# Patient Record
Sex: Female | Born: 1965 | Race: White | Hispanic: No | Marital: Married | Smoking: Never smoker
Health system: Southern US, Community
[De-identification: ages and names within clinical notes are randomized; demographics above are authoritative.]

## PROBLEM LIST (undated history)

## (undated) DIAGNOSIS — D239 Other benign neoplasm of skin, unspecified: Secondary | ICD-10-CM

## (undated) DIAGNOSIS — K9041 Non-celiac gluten sensitivity: Secondary | ICD-10-CM

## (undated) DIAGNOSIS — Z1211 Encounter for screening for malignant neoplasm of colon: Secondary | ICD-10-CM

## (undated) DIAGNOSIS — L570 Actinic keratosis: Secondary | ICD-10-CM

## (undated) DIAGNOSIS — Z8619 Personal history of other infectious and parasitic diseases: Secondary | ICD-10-CM

## (undated) HISTORY — DX: Encounter for screening for malignant neoplasm of colon: Z12.11

## (undated) HISTORY — DX: Actinic keratosis: L57.0

## (undated) HISTORY — DX: Personal history of other infectious and parasitic diseases: Z86.19

## (undated) HISTORY — DX: Other benign neoplasm of skin, unspecified: D23.9

## (undated) HISTORY — DX: Non-celiac gluten sensitivity: K90.41

---
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
## 2007-08-11 ENCOUNTER — Ambulatory Visit: Payer: Self-pay | Admitting: Obstetrics and Gynecology

## 2007-12-24 ENCOUNTER — Encounter: Admission: RE | Admit: 2007-12-24 | Discharge: 2007-12-24 | Payer: Self-pay | Admitting: Gynecology

## 2008-07-14 ENCOUNTER — Ambulatory Visit (HOSPITAL_COMMUNITY): Admission: RE | Admit: 2008-07-14 | Discharge: 2008-07-14 | Payer: Self-pay | Admitting: Gynecology

## 2008-10-13 ENCOUNTER — Encounter (INDEPENDENT_AMBULATORY_CARE_PROVIDER_SITE_OTHER): Payer: Self-pay | Admitting: Obstetrics & Gynecology

## 2008-10-13 ENCOUNTER — Ambulatory Visit (HOSPITAL_COMMUNITY): Admission: RE | Admit: 2008-10-13 | Discharge: 2008-10-13 | Payer: Self-pay | Admitting: Obstetrics & Gynecology

## 2010-01-27 ENCOUNTER — Inpatient Hospital Stay (HOSPITAL_COMMUNITY): Admission: AD | Admit: 2010-01-27 | Discharge: 2010-01-27 | Payer: Self-pay | Admitting: Obstetrics and Gynecology

## 2010-01-27 ENCOUNTER — Inpatient Hospital Stay (HOSPITAL_COMMUNITY): Admission: AD | Admit: 2010-01-27 | Discharge: 2010-01-30 | Payer: Self-pay | Admitting: Obstetrics and Gynecology

## 2010-02-04 ENCOUNTER — Ambulatory Visit: Payer: Self-pay | Admitting: Pediatrics

## 2010-02-19 DIAGNOSIS — D239 Other benign neoplasm of skin, unspecified: Secondary | ICD-10-CM

## 2010-02-19 DIAGNOSIS — C4491 Basal cell carcinoma of skin, unspecified: Secondary | ICD-10-CM

## 2010-02-19 HISTORY — DX: Basal cell carcinoma of skin, unspecified: C44.91

## 2010-02-19 HISTORY — DX: Other benign neoplasm of skin, unspecified: D23.9

## 2010-12-09 LAB — CBC
HCT: 33.4 % — ABNORMAL LOW (ref 36.0–46.0)
Hemoglobin: 9.6 g/dL — ABNORMAL LOW (ref 12.0–15.0)
MCHC: 34.5 g/dL (ref 30.0–36.0)
MCHC: 34.9 g/dL (ref 30.0–36.0)
MCV: 97.2 fL (ref 78.0–100.0)
Platelets: 148 10*3/uL — ABNORMAL LOW (ref 150–400)
RDW: 13.4 % (ref 11.5–15.5)
WBC: 11.9 10*3/uL — ABNORMAL HIGH (ref 4.0–10.5)
WBC: 11.9 10*3/uL — ABNORMAL HIGH (ref 4.0–10.5)

## 2011-01-05 LAB — CBC
HCT: 35 % — ABNORMAL LOW (ref 36.0–46.0)
Hemoglobin: 12.1 g/dL (ref 12.0–15.0)
MCV: 92.7 fL (ref 78.0–100.0)
Platelets: 200 10*3/uL (ref 150–400)
RDW: 13.2 % (ref 11.5–15.5)

## 2011-01-05 LAB — ABO/RH: ABO/RH(D): A POS

## 2011-02-03 NOTE — Op Note (Signed)
Kara Spencer, Kara Spencer             ACCOUNT NO.:  [id number]   MEDICAL RECORD NO.:  [id number]          PATIENT TYPE:  AMB   LOCATION:  SDC                           FACILITY:  [facility]   PHYSICIAN:  Freddy Finner, M.D.   DATE OF BIRTH:  1966/04/10   DATE OF PROCEDURE:  10/13/2008  DATE OF DISCHARGE:                               OPERATIVE REPORT   PREOPERATIVE DIAGNOSIS:  Missed abortion.   POSTOPERATIVE DIAGNOSIS:  Missed abortion.   OPERATIVE PROCEDURE:  Dilation and evacuation.   ANESTHESIA:  Managed IV sedation and paracervical block.   ESTIMATED INTRAOPERATIVE BLOOD LOSS:  10 mL.   INTRAOPERATIVE COMPLICATIONS:  None.  Surgically removed tissue was  submitted for chromosomal analysis.   The patient is a 45 year old gravida 2, para 0 who has IVF pregnancy  which was found to be nonviable approximately 1 week ago.  She is  admitted now for dilation and evacuation.   DESCRIPTION OF PROCEDURE:  She was admitted on the morning of surgery.  She was brought to the operating room, placed under heavy IV sedation,  placed in dorsal lithotomy position using the [place] stirrup system.  Betadine prep of mons, perineum and vagina was carried out in the usual  fashion.  Bivalve speculum was introduced.  Cervix was visualized.  1%  Xylocaine was injected into the anterior cervical lip which was then  grasped with a single-tooth tenaculum.  Additional Xylocaine was  injected at 4 and 8 o'clock in the vaginal fornices as a paracervical  block.  A total of 10 mL of solution was used.  The cervix was then  progressively dilated to 25 with [facility] dilators.  An 8 mm curved suction  cannula was introduced.  Aspiration produced obvious products of  conception.  This was continued until it was felt that the cavity was  completely evacuated.  Gentle thorough curettage was then carried out  and exploration with Randall stone forceps followed by repeat vacuum  aspiration, confirming complete evacuation  of the cavity.  The patient  tolerated the operative procedure well.  The instruments were removed.  She was given  Toradol 30 mg IV, 30 mg IM.  She was taken to recovery in good  condition.  She has Darvocet to be taken as needed for postoperative  pain.  She is to return to the office in approximately 1 week for  postoperative follow-up.  She is given routine outpatient surgical  instructions.      Freddy Finner, M.D.  Electronically Signed     WRN/MEDQ  D:  10/13/2008  T:  10/13/2008  Job:  [id number]   cc:   Leatha Gilding. Mezer, M.D.  Fax: [phone]

## 2011-06-23 LAB — CBC
HCT: 41
Platelets: 226
RBC: 4.34
RDW: 13
WBC: 6.1

## 2011-06-23 LAB — ESTRADIOL: Estradiol: 89.6

## 2011-06-23 LAB — TSH: TSH: 1.158

## 2011-06-23 LAB — FOLLICLE STIMULATING HORMONE: FSH: 6.1

## 2013-07-13 ENCOUNTER — Other Ambulatory Visit: Payer: Self-pay | Admitting: Gynecology

## 2013-07-13 DIAGNOSIS — R928 Other abnormal and inconclusive findings on diagnostic imaging of breast: Secondary | ICD-10-CM

## 2013-08-01 ENCOUNTER — Ambulatory Visit
Admission: RE | Admit: 2013-08-01 | Discharge: 2013-08-01 | Disposition: A | Discharge status: Home / Self Care | Payer: BC Managed Care – PPO | Source: Ambulatory Visit | Attending: Gynecology | Admitting: Gynecology

## 2013-08-01 DIAGNOSIS — R928 Other abnormal and inconclusive findings on diagnostic imaging of breast: Secondary | ICD-10-CM

## 2014-01-29 ENCOUNTER — Ambulatory Visit (INDEPENDENT_AMBULATORY_CARE_PROVIDER_SITE_OTHER): Payer: BC Managed Care – PPO | Admitting: Adult Health

## 2014-01-29 ENCOUNTER — Encounter: Payer: Self-pay | Admitting: Adult Health

## 2014-01-29 ENCOUNTER — Encounter (INDEPENDENT_AMBULATORY_CARE_PROVIDER_SITE_OTHER): Payer: Self-pay

## 2014-01-29 ENCOUNTER — Ambulatory Visit (INDEPENDENT_AMBULATORY_CARE_PROVIDER_SITE_OTHER)
Admission: RE | Admit: 2014-01-29 | Discharge: 2014-01-29 | Disposition: A | Discharge status: Home / Self Care | Payer: BC Managed Care – PPO | Source: Ambulatory Visit | Attending: Adult Health | Admitting: Adult Health

## 2014-01-29 VITALS — BP 122/64 | HR 71 | Temp 98.2°F | Resp 12 | Ht 69.25 in | Wt 135.5 lb

## 2014-01-29 DIAGNOSIS — R071 Chest pain on breathing: Secondary | ICD-10-CM

## 2014-01-29 DIAGNOSIS — R0789 Other chest pain: Secondary | ICD-10-CM

## 2014-01-29 NOTE — Patient Instructions (Signed)
  Please go to our [facility] for your chest xray.  Schedule your complete physical at your earliest convenience. You will need to be fasting.

## 2014-01-29 NOTE — Progress Notes (Signed)
Patient ID: Kara Spencer, female   DOB: 1966-09-09, 48 y.o.   MRN: [id number]   Subjective:    Patient ID: Kara Spencer, female    DOB: [birth year], 48 y.o.   MRN: [id number]  HPI Pt is a 48 y/o female who presents to clinic to establish care. She has not had a PCP. She is followed by GYN. Up to date on mammogram and PAP. She has vague discomfort over chest/rib area on the left side. Both her parents had lung cancer and she is concerned; although, she has never smoked. No shortness of breath.     Past Medical History  Diagnosis Date  . History of chicken pox   . Gluten intolerance     Wheat, caicin     Past Surgical History  Procedure Laterality Date  . Tonsillectomy and adenoidectomy  1973     Family History  Problem Relation Age of Onset  . Lung cancer Mother   . Stroke Mother   . Cancer Mother     lung cancer  . Lung cancer Father   . Cancer Father     lung cancer - smoker  . Cancer Paternal Aunt     ovarian cancer  . Early death Sister     automobile accident  . Heart disease Paternal Grandfather   . Thyroid disease Maternal Grandmother      History   Social History  . Marital Status: Married    Spouse Name: N/A    Number of Children: 1  . Years of Education: 39   Occupational History  . Speech Language Pathologist    Social History Main Topics  . Smoking status: Never Smoker   . Smokeless tobacco: Never Used  . Alcohol Use: Yes     Comment: 2-3 times a month  . Drug Use: No  . Sexual Activity: Yes   Other Topics Concern  . Not on file   Social History Narrative   Saira grew up in [place]. She attended [employer] and obtained her Paediatric nurse in Communication Disorders. She then obtained her Masters in Sport and exercise psychologist from [employer]. She currently lives in [place] with her husband and daughter.    Review of Systems  Constitutional: Negative.   HENT: Negative.   Eyes: Negative.     Respiratory: Negative.        Occasional chest wall pain. She has two parents that developed lung cancer and is concerned. No hx of smoking.  Cardiovascular: Negative.   Gastrointestinal: Negative.   Endocrine: Negative.   Genitourinary: Negative.   Musculoskeletal: Negative.   Skin: Negative.   Allergic/Immunologic: Negative.   Neurological: Negative.   Hematological: Negative.   Psychiatric/Behavioral: Negative.        Objective:  BP 122/64  Pulse 71  Temp(Src) 98.2 F (36.8 C) (Oral)  Resp 12  Ht 5' 9.25" (1.759 m)  Wt 135 lb 8 oz (61.462 kg)  BMI 19.86 kg/m2  SpO2 99%  LMP 01/22/2014   Physical Exam  Constitutional: She is oriented to person, place, and time. She appears well-developed and well-nourished. No distress.  HENT:  Head: Normocephalic and atraumatic.  Right Ear: External ear normal.  Left Ear: External ear normal.  Nose: Nose normal.  Mouth/Throat: Oropharynx is clear and moist.  Eyes: Conjunctivae and EOM are normal. Pupils are equal, round, and reactive to light.  Neck: Normal range of motion. Neck supple. No tracheal deviation present. No thyromegaly present.  Cardiovascular: Normal rate, regular  rhythm, normal heart sounds and intact distal pulses.  Exam reveals no gallop and no friction rub.   No murmur heard. Pulmonary/Chest: Effort normal and breath sounds normal. No respiratory distress. She has no wheezes. She has no rales.  Abdominal: Soft. Bowel sounds are normal. She exhibits no distension and no mass. There is no tenderness. There is no rebound and no guarding.  Musculoskeletal: Normal range of motion. She exhibits no edema and no tenderness.  Lymphadenopathy:    She has no cervical adenopathy.  Neurological: She is alert and oriented to person, place, and time. She has normal reflexes. No cranial nerve deficit. Coordination normal.  Skin: Skin is warm and dry.  Psychiatric: She has a normal mood and affect. Her behavior is normal. Judgment  and thought content normal.      Assessment & Plan:   1. Left-sided chest wall pain She has very vague chest wall pain/discomfort. Does not occur all the time. She is concerned about both parents with lung cancer. ?Muscular. Will send for chest xray r/o costochondritis.  - DG Chest 2 View; Future

## 2014-01-29 NOTE — Progress Notes (Signed)
Pre visit review using our clinic review tool, if applicable. No additional management support is needed unless otherwise documented below in the visit note. 

## 2014-02-01 ENCOUNTER — Other Ambulatory Visit: Payer: BC Managed Care – PPO

## 2014-02-01 ENCOUNTER — Telehealth: Payer: Self-pay | Admitting: *Deleted

## 2014-02-01 NOTE — Telephone Encounter (Signed)
What labs and dX?  

## 2014-02-01 NOTE — Telephone Encounter (Signed)
I have not ordered any labs yet. She is supposed to come for her physical exam and then I am ordering the labs.

## 2014-02-02 NOTE — Telephone Encounter (Signed)
Called pt didn't get an answer will try again later  

## 2014-02-05 ENCOUNTER — Other Ambulatory Visit: Payer: BC Managed Care – PPO

## 2014-02-19 ENCOUNTER — Encounter: Payer: Self-pay | Admitting: Adult Health

## 2014-02-27 ENCOUNTER — Ambulatory Visit (INDEPENDENT_AMBULATORY_CARE_PROVIDER_SITE_OTHER): Payer: BC Managed Care – PPO | Admitting: Adult Health

## 2014-02-27 ENCOUNTER — Encounter: Payer: Self-pay | Admitting: Adult Health

## 2014-02-27 VITALS — BP 116/60 | HR 75 | Temp 98.4°F | Resp 14 | Ht 69.0 in | Wt 136.5 lb

## 2014-02-27 DIAGNOSIS — Z0000 Encounter for general adult medical examination without abnormal findings: Secondary | ICD-10-CM | POA: Insufficient documentation

## 2014-02-27 DIAGNOSIS — Z23 Encounter for immunization: Secondary | ICD-10-CM

## 2014-02-27 NOTE — Progress Notes (Signed)
Pre visit review using our clinic review tool, if applicable. No additional management support is needed unless otherwise documented below in the visit note. 

## 2014-02-27 NOTE — Progress Notes (Signed)
Subjective:    Patient ID: Kara Spencer, female    DOB: 09-Apr-1966, 48 y.o.   MRN: [id number]  HPI  Pleasant 48 yo female presents today for her annual physical. Indicates that overall she is feeling well. She had some diarrhea but has since cleared up. Her gastrointestinal problems have improved significantly since she has not eaten gluten. Describes symptoms of reflux, only when she eats late.   Past Medical History  Diagnosis Date  . History of chicken pox   . Gluten intolerance     Wheat, caicin    Current Outpatient Prescriptions on File Prior to Visit  Medication Sig Dispense Refill  . Ascorbic Acid (VITAMIN C) 1000 MG tablet Take 1,000 mg by mouth 3 (three) times daily.      . ASTAXANTHIN PO Take 12 mg by mouth daily.      . B Complex-C (B-COMPLEX WITH VITAMIN C) tablet Take 1 tablet by mouth daily.      Marland Kitchen DIGESTIVE ENZYMES PO Take 1 tablet by mouth 3 (three) times daily.      . Misc Natural Products (CHLORELLA) 500 MG CAPS Take 500 mg by mouth daily.      . Multiple Vitamin (MULTIVITAMIN) capsule Take 1 capsule by mouth 2 (two) times daily.       . Nutritional Supplements (PYCNOGENOL) 30 MG CAPS Take 1 capsule by mouth daily.      . Omega-3 Fatty Acids (OMEGA 3 PO) Take 800 mg by mouth. Liquid form. At bedtime      . Passion Flower-Valerian 500-500 MG CAPS Take 1 capsule by mouth as needed.      . Probiotic Product (PROBIOTIC DAILY PO) Take 1 tablet by mouth daily.      Marland Kitchen VITAMIN D, CHOLECALCIFEROL, PO Take 5,000 Units by mouth.      . vitamin E 400 UNIT capsule Take 400 Units by mouth daily.       No current facility-administered medications on file prior to visit.     Review of Systems  Constitutional: Negative.  Negative for fever, chills, activity change, fatigue and unexpected weight change.  HENT: Negative for congestion, dental problem, ear discharge, ear pain, facial swelling, hearing loss, mouth sores, nosebleeds, postnasal drip, rhinorrhea, sinus pressure,  sneezing, sore throat, tinnitus, trouble swallowing and voice change.   Eyes: Negative for photophobia, pain, discharge, redness, itching and visual disturbance.       Last eye exam was June 2014 - will be scheduling  Respiratory: Negative for apnea, cough, choking, chest tightness, shortness of breath, wheezing and stridor.   Cardiovascular: Negative.  Negative for chest pain, palpitations and leg swelling.  Gastrointestinal: Negative.  Negative for nausea, vomiting, abdominal pain, diarrhea, constipation, blood in stool, abdominal distention, anal bleeding and rectal pain.  Endocrine: Negative for cold intolerance, heat intolerance, polydipsia, polyphagia and polyuria.  Genitourinary: Positive for frequency. Negative for dysuria, urgency, hematuria, flank pain, enuresis, difficulty urinating, genital sores, menstrual problem, pelvic pain and dyspareunia.  Musculoskeletal: Negative for arthralgias, back pain, gait problem, joint swelling, myalgias and neck stiffness.  Skin: Negative for color change, pallor, rash and wound.  Allergic/Immunologic: Negative.   Neurological: Negative.  Negative for dizziness, tremors, seizures, syncope, facial asymmetry, speech difficulty, weakness, light-headedness, numbness and headaches.  Hematological: Negative.  Does not bruise/bleed easily.  Psychiatric/Behavioral: Negative.  Negative for hallucinations, behavioral problems, confusion, dysphoric mood, decreased concentration and agitation. The patient is not nervous/anxious and is not hyperactive.        Objective:  BP 116/60  Pulse 75  Temp(Src) 98.4 F (36.9 C) (Oral)  Resp 14  Ht 5' 9"  (1.753 m)  Wt 136 lb 8 oz (61.916 kg)  BMI 20.15 kg/m2  SpO2 99%  LMP 02/17/2014   Physical Exam  Vitals reviewed. Constitutional: She is oriented to person, place, and time. She appears well-developed and well-nourished. She is cooperative.  HENT:  Head: Normocephalic.  Right Ear: Hearing, tympanic membrane  and ear canal normal.  Left Ear: Hearing, tympanic membrane, external ear and ear canal normal.  Nose: Nose normal. Right sinus exhibits no maxillary sinus tenderness and no frontal sinus tenderness. Left sinus exhibits no maxillary sinus tenderness and no frontal sinus tenderness.  Mouth/Throat: Uvula is midline, oropharynx is clear and moist and mucous membranes are normal.  Eyes: Conjunctivae, EOM and lids are normal. Pupils are equal, round, and reactive to light.  Neck: Trachea normal, normal range of motion, full passive range of motion without pain and phonation normal. Neck supple. No hepatojugular reflux and no JVD present. Carotid bruit is not present. No thyromegaly present.  Cardiovascular: Normal rate, regular rhythm, S1 normal, S2 normal, intact distal pulses and normal pulses.  Exam reveals no gallop.   No murmur heard. Pulmonary/Chest: Effort normal and breath sounds normal.  Abdominal: Soft. Normal appearance, normal aorta and bowel sounds are normal. There is no hepatosplenomegaly. There is no tenderness. There is no CVA tenderness.  Musculoskeletal: Normal range of motion.  Lymphadenopathy:    She has no cervical adenopathy.    She has no axillary adenopathy.  Neurological: She is alert and oriented to person, place, and time. She has normal strength and normal reflexes. No cranial nerve deficit or sensory deficit. She displays a negative Romberg sign. GCS eye subscore is 4. GCS verbal subscore is 5. GCS motor subscore is 6.  Skin: Skin is warm, dry and intact.  Psychiatric: She has a normal mood and affect. Her speech is normal and behavior is normal. Judgment and thought content normal. Cognition and memory are normal.       Assessment & Plan:   1. Routine general medical examination at a health care facility Normal exam. Screenings addressed. Labs and return for annual exam in 1 year - CBC; Future - Vitamin D 1,25 dihydroxy; Future - Comp Met (CMET); Future - Lipid  Profile; Future - TSH; Future - B12; Future  2. Need for diphtheria-tetanus-pertussis (Tdap) vaccine, adult/adolescent Given Tdap

## 2014-02-27 NOTE — Patient Instructions (Signed)
  You had your yearly physical exam today.  You were given your Tdap vaccine in clinic today.  Return in the morning for fasting labs. Drink water prior to lab appointment. We will contact you once the results are available as well as any further instructions.

## 2014-02-28 ENCOUNTER — Other Ambulatory Visit (INDEPENDENT_AMBULATORY_CARE_PROVIDER_SITE_OTHER): Payer: BC Managed Care – PPO

## 2014-02-28 ENCOUNTER — Telehealth: Payer: Self-pay | Admitting: Adult Health

## 2014-02-28 ENCOUNTER — Encounter: Payer: Self-pay | Admitting: Adult Health

## 2014-02-28 DIAGNOSIS — Z0000 Encounter for general adult medical examination without abnormal findings: Secondary | ICD-10-CM

## 2014-02-28 LAB — COMPREHENSIVE METABOLIC PANEL
ALT: 21 U/L (ref 0–35)
AST: 19 U/L (ref 0–37)
Albumin: 3.8 g/dL (ref 3.5–5.2)
Alkaline Phosphatase: 32 U/L — ABNORMAL LOW (ref 39–117)
BUN: 10 mg/dL (ref 6–23)
CHLORIDE: 106 meq/L (ref 96–112)
CO2: 28 mEq/L (ref 19–32)
Calcium: 9.1 mg/dL (ref 8.4–10.5)
Creatinine, Ser: 0.9 mg/dL (ref 0.4–1.2)
GFR: 74.52 mL/min (ref >60.00)
Glucose, Bld: 83 mg/dL (ref 70–99)
Potassium: 3.9 mEq/L (ref 3.5–5.1)
Sodium: 138 mEq/L (ref 135–145)
TOTAL PROTEIN: 6 g/dL (ref 6.0–8.3)
Total Bilirubin: 0.7 mg/dL (ref 0.2–1.2)

## 2014-02-28 LAB — CBC
HCT: 38.9 % (ref 36.0–46.0)
Hemoglobin: 13 g/dL (ref 12.0–15.0)
MCHC: 33.4 g/dL (ref 30.0–36.0)
MCV: 94.9 fl (ref 78.0–100.0)
Platelets: 189 10*3/uL (ref 150.0–400.0)
RBC: 4.1 Mil/uL (ref 3.87–5.11)
RDW: 13 % (ref 11.5–15.5)
WBC: 4.4 10*3/uL (ref 4.0–10.5)

## 2014-02-28 LAB — LIPID PANEL
Cholesterol: 167 mg/dL (ref 0–200)
HDL: 60.2 mg/dL (ref >39.00)
LDL Cholesterol: 99 mg/dL (ref 0–99)
NonHDL: 106.8
Total CHOL/HDL Ratio: 3
Triglycerides: 38 mg/dL (ref 0.0–149.0)
VLDL: 7.6 mg/dL (ref 0.0–40.0)

## 2014-02-28 LAB — VITAMIN B12: VITAMIN B 12: 1073 pg/mL — AB (ref 211–911)

## 2014-02-28 LAB — TSH: TSH: 0.43 u[IU]/mL (ref 0.35–4.50)

## 2014-02-28 NOTE — Telephone Encounter (Signed)
b12 level high

## 2014-03-04 LAB — VITAMIN D 1,25 DIHYDROXY
VITAMIN D3 1, 25 (OH): 58 pg/mL
Vitamin D 1, 25 (OH)2 Total: 58 pg/mL (ref 18–72)
Vitamin D2 1, 25 (OH)2: <8 pg/mL

## 2014-03-05 ENCOUNTER — Encounter: Payer: Self-pay | Admitting: Adult Health

## 2014-03-05 ENCOUNTER — Encounter: Payer: BC Managed Care – PPO | Admitting: Adult Health

## 2014-06-15 ENCOUNTER — Encounter: Payer: Self-pay | Admitting: Internal Medicine

## 2014-06-15 ENCOUNTER — Ambulatory Visit (INDEPENDENT_AMBULATORY_CARE_PROVIDER_SITE_OTHER): Payer: BC Managed Care – PPO | Admitting: Internal Medicine

## 2014-06-15 VITALS — BP 100/72 | HR 73 | Temp 98.7°F | Ht 69.0 in | Wt 132.8 lb

## 2014-06-15 DIAGNOSIS — R197 Diarrhea, unspecified: Secondary | ICD-10-CM

## 2014-06-15 LAB — CBC WITH DIFFERENTIAL/PLATELET
BASOS ABS: 0 10*3/uL (ref 0.0–0.1)
Basophils Relative: 0 % (ref 0–1)
EOS ABS: 0.1 10*3/uL (ref 0.0–0.7)
Eosinophils Relative: 1 % (ref 0–5)
HEMATOCRIT: 40.7 % (ref 36.0–46.0)
Hemoglobin: 13.8 g/dL (ref 12.0–15.0)
Lymphocytes Relative: 37 % (ref 12–46)
Lymphs Abs: 2.1 10*3/uL (ref 0.7–4.0)
MCH: 31.2 pg (ref 26.0–34.0)
MCHC: 33.9 g/dL (ref 30.0–36.0)
MCV: 91.9 fL (ref 78.0–100.0)
Monocytes Absolute: 0.5 10*3/uL (ref 0.1–1.0)
Monocytes Relative: 8 % (ref 3–12)
Neutro Abs: 3.1 10*3/uL (ref 1.7–7.7)
Neutrophils Relative %: 54 % (ref 43–77)
PLATELETS: 229 10*3/uL (ref 150–400)
RBC: 4.43 MIL/uL (ref 3.87–5.11)
RDW: 13.7 % (ref 11.5–15.5)
WBC: 5.7 10*3/uL (ref 4.0–10.5)

## 2014-06-15 LAB — COMPREHENSIVE METABOLIC PANEL
ALT: 10 U/L (ref 0–35)
AST: 13 U/L (ref 0–37)
Albumin: 4.4 g/dL (ref 3.5–5.2)
Alkaline Phosphatase: 35 U/L — ABNORMAL LOW (ref 39–117)
BUN: 14 mg/dL (ref 6–23)
CO2: 27 mEq/L (ref 19–32)
CREATININE: 0.98 mg/dL (ref 0.50–1.10)
Calcium: 9.6 mg/dL (ref 8.4–10.5)
Chloride: 103 mEq/L (ref 96–112)
Glucose, Bld: 98 mg/dL (ref 70–99)
Potassium: 5 mEq/L (ref 3.5–5.3)
Sodium: 137 mEq/L (ref 135–145)
Total Bilirubin: 0.6 mg/dL (ref 0.2–1.2)
Total Protein: 6.5 g/dL (ref 6.0–8.3)

## 2014-06-15 NOTE — Patient Instructions (Signed)
Labs today.  Follow up in 4 weeks or sooner as needed. 

## 2014-06-15 NOTE — Progress Notes (Signed)
Pre visit review using our clinic review tool, if applicable. No additional management support is needed unless otherwise documented below in the visit note. 

## 2014-06-15 NOTE — Assessment & Plan Note (Signed)
Symptoms of loose stools for 4 weeks. Will check CBC, CMP. Will send stool culture, CDiff, and stool for ova and parasites. We discussed if these tests are unrevealing then will plan for GI evaluation with colonoscopy. Follow up in 4 weeks and prn.

## 2014-06-15 NOTE — Progress Notes (Signed)
   Subjective:    Patient ID: Kara Spencer, female    DOB: 01-15-1966, 48 y.o.   MRN: [id number]  HPI 48YO female presents for acute visit.  Diarrhea - Started 4 weeks ago after eating at food [employer]). Occurs only in the morning. Watery/soft diarrhea, non-bloody. More like pudding consistency. No urgency. No abdominal pain. Occasional nausea. Had similar episode in May which resolved. Not on antibiotics. No travel or camping. No fever.   Review of Systems  Constitutional: Negative for fever, chills, appetite change, fatigue and unexpected weight change.  Eyes: Negative for visual disturbance.  Respiratory: Negative for shortness of breath.   Cardiovascular: Negative for chest pain and leg swelling.  Gastrointestinal: Positive for nausea and diarrhea. Negative for vomiting, abdominal pain, constipation, blood in stool, abdominal distention and anal bleeding.  Skin: Negative for color change and rash.  Hematological: Negative for adenopathy. Does not bruise/bleed easily.  Psychiatric/Behavioral: Negative for dysphoric mood. The patient is not nervous/anxious.        Objective:    BP 100/72  Pulse 73  Temp(Src) 98.7 F (37.1 C) (Oral)  Ht $R'5\' 9"'Sl$  (1.753 m)  Wt 132 lb 12 oz (60.215 kg)  BMI 19.59 kg/m2  SpO2 99%  LMP 05/21/2014 Physical Exam  Constitutional: She is oriented to person, place, and time. She appears well-developed and well-nourished. No distress.  HENT:  Head: Normocephalic and atraumatic.  Right Ear: External ear normal.  Left Ear: External ear normal.  Nose: Nose normal.  Mouth/Throat: Oropharynx is clear and moist.  Eyes: Conjunctivae and EOM are normal. Pupils are equal, round, and reactive to light. Right eye exhibits no discharge.  Neck: Normal range of motion. Neck supple. No thyromegaly present.  Cardiovascular: Normal rate, regular rhythm, normal heart sounds and intact distal pulses.  Exam reveals no gallop and no friction rub.   No murmur  heard. Pulmonary/Chest: Effort normal. No respiratory distress. She has no wheezes. She has no rales.  Abdominal: Soft. Bowel sounds are normal. She exhibits no distension and no mass. There is no tenderness. There is no rebound and no guarding.  Musculoskeletal: Normal range of motion. She exhibits no edema and no tenderness.  Lymphadenopathy:    She has no cervical adenopathy.  Neurological: She is alert and oriented to person, place, and time. No cranial nerve deficit. Coordination normal.  Skin: Skin is warm and dry. No rash noted. She is not diaphoretic. No erythema. No pallor.  Psychiatric: She has a normal mood and affect. Her behavior is normal. Judgment and thought content normal.          Assessment & Plan:   Problem List Items Addressed This Visit     Unprioritized   Diarrhea - Primary     Symptoms of loose stools for 4 weeks. Will check CBC, CMP. Will send stool culture, CDiff, and stool for ova and parasites. We discussed if these tests are unrevealing then will plan for GI evaluation with colonoscopy. Follow up in 4 weeks and prn.    Relevant Orders      CBC with Differential      Comp Met (CMET)      Stool Culture      Ova and parasite examination      Stool C-Diff Toxin Assay      H. pylori breath test       Return in about 4 weeks (around 07/13/2014) for Recheck.

## 2014-06-17 LAB — H. PYLORI BREATH TEST: H. PYLORI BREATH TEST: NOT DETECTED

## 2014-06-19 LAB — OVA AND PARASITE EXAMINATION: OP: NONE SEEN

## 2014-06-19 LAB — C. DIFFICILE GDH AND TOXIN A/B
C. DIFFICILE GDH: NOT DETECTED
C. difficile Toxin A/B: NOT DETECTED

## 2014-06-22 LAB — STOOL CULTURE

## 2015-05-06 ENCOUNTER — Other Ambulatory Visit: Payer: Self-pay | Admitting: Gynecology

## 2015-05-07 LAB — CYTOLOGY - PAP

## 2015-07-03 ENCOUNTER — Encounter: Payer: Self-pay | Admitting: Family Medicine

## 2015-07-03 ENCOUNTER — Ambulatory Visit (INDEPENDENT_AMBULATORY_CARE_PROVIDER_SITE_OTHER): Payer: BC Managed Care – PPO | Admitting: Family Medicine

## 2015-07-03 VITALS — BP 102/62 | HR 75 | Temp 98.1°F | Resp 12 | Ht 69.0 in | Wt 132.8 lb

## 2015-07-03 DIAGNOSIS — Z1321 Encounter for screening for nutritional disorder: Secondary | ICD-10-CM | POA: Diagnosis not present

## 2015-07-03 DIAGNOSIS — L659 Nonscarring hair loss, unspecified: Secondary | ICD-10-CM | POA: Diagnosis not present

## 2015-07-03 DIAGNOSIS — Z13220 Encounter for screening for lipoid disorders: Secondary | ICD-10-CM

## 2015-07-03 DIAGNOSIS — Z87898 Personal history of other specified conditions: Secondary | ICD-10-CM

## 2015-07-03 DIAGNOSIS — Z8669 Personal history of other diseases of the nervous system and sense organs: Secondary | ICD-10-CM

## 2015-07-03 DIAGNOSIS — Z0000 Encounter for general adult medical examination without abnormal findings: Secondary | ICD-10-CM | POA: Diagnosis not present

## 2015-07-03 DIAGNOSIS — Z130 Encounter for screening for diseases of the blood and blood-forming organs and certain disorders involving the immune mechanism: Secondary | ICD-10-CM | POA: Diagnosis not present

## 2015-07-03 HISTORY — DX: Personal history of other specified conditions: Z87.898

## 2015-07-03 LAB — CBC
HCT: 41.9 % (ref 36.0–46.0)
Hemoglobin: 13.6 g/dL (ref 12.0–15.0)
MCHC: 32.4 g/dL (ref 30.0–36.0)
MCV: 96.1 fl (ref 78.0–100.0)
Platelets: 219 10*3/uL (ref 150.0–400.0)
RBC: 4.36 Mil/uL (ref 3.87–5.11)
RDW: 13.3 % (ref 11.5–15.5)
WBC: 4.6 10*3/uL (ref 4.0–10.5)

## 2015-07-03 LAB — LIPID PANEL
CHOL/HDL RATIO: 3
Cholesterol: 156 mg/dL (ref 0–200)
HDL: 62.2 mg/dL (ref >39.00)
LDL CALC: 85 mg/dL (ref 0–99)
NONHDL: 93.95
Triglycerides: 45 mg/dL (ref 0.0–149.0)
VLDL: 9 mg/dL (ref 0.0–40.0)

## 2015-07-03 LAB — COMPREHENSIVE METABOLIC PANEL
ALT: 17 U/L (ref 0–35)
AST: 19 U/L (ref 0–37)
Albumin: 4.1 g/dL (ref 3.5–5.2)
Alkaline Phosphatase: 37 U/L — ABNORMAL LOW (ref 39–117)
BUN: 12 mg/dL (ref 6–23)
CHLORIDE: 104 meq/L (ref 96–112)
CO2: 27 mEq/L (ref 19–32)
CREATININE: 0.89 mg/dL (ref 0.40–1.20)
Calcium: 9.4 mg/dL (ref 8.4–10.5)
GFR: 72.16 mL/min (ref >60.00)
Glucose, Bld: 78 mg/dL (ref 70–99)
Potassium: 3.9 mEq/L (ref 3.5–5.1)
SODIUM: 139 meq/L (ref 135–145)
Total Bilirubin: 0.5 mg/dL (ref 0.2–1.2)
Total Protein: 6.5 g/dL (ref 6.0–8.3)

## 2015-07-03 LAB — VITAMIN D 25 HYDROXY (VIT D DEFICIENCY, FRACTURES): VITD: 77.86 ng/mL (ref 30.00–100.00)

## 2015-07-03 LAB — TSH: TSH: 0.92 u[IU]/mL (ref 0.35–4.50)

## 2015-07-03 NOTE — Progress Notes (Signed)
Subjective:  Patient ID: Kara Spencer, female    DOB: 17-Mar-1966  Age: 49 y.o. MRN: [id number]  CC: Annual physical exam; Establish care   HPI:  49 year old female presents to clinic today to establish care with me. She is in need of an annual physical exam.  Preventative Healthcare  Pap smear: UTD.  Mammogram/Breast exam: Has upcoming mammogram in Nov.  Colonoscopy: N/A.  Immunizations: UTD.  Labs: In need of labs today.   Diet/Exercise: Eats healthy and has dietary restrictions (wheat, casein)  Alcohol use: No.  Smoking/tobacco use: No.  Regular dental exams: Yes.   PMH, Surgical Hx, Family Hx, Social History reviewed and updated as below.  Past Medical History  Diagnosis Date  . History of chicken pox   . Gluten intolerance     Wheat, caicin   Past Surgical History  Procedure Laterality Date  . Tonsillectomy and adenoidectomy  1973   Family History  Problem Relation Age of Onset  . Lung cancer Mother   . Stroke Mother   . Cancer Mother     lung cancer  . Lung cancer Father   . Cancer Father     lung cancer - smoker  . Cancer Paternal Aunt     ovarian cancer  . Early death Sister     automobile accident  . Heart disease Paternal Grandfather   . Thyroid disease Maternal Grandmother    Social History  Substance Use Topics  . Smoking status: Never Smoker   . Smokeless tobacco: Never Used  . Alcohol Use: Yes     Comment: 2-3 times a month   Review of Systems  Gastrointestinal:       Diarrhea last month.   Neurological:       History of vertigo.   All other systems negative.  Objective:  BP 102/62 mmHg  Pulse 75  Temp(Src) 98.1 F (36.7 C) (Oral)  Resp 12  Ht _0  (1.753 m)  Wt 132 lb 12.8 oz (60.238 kg)  BMI 19.60 kg/m2  SpO2 98%  LMP 06/22/2015 (Approximate)  BP/Weight 07/03/2015 5/63/8756 12/22/3293  Systolic BP 188 416 606  Diastolic BP 62 72 60  Wt. (Lbs) 132.8 132.75 136.5  BMI 19.6 19.59 20.15   Physical Exam    Constitutional: She is oriented to person, place, and time. She appears well-developed and well-nourished. No distress.  HENT:  Head: Normocephalic and atraumatic.  Nose: Nose normal.  Mouth/Throat: Oropharynx is clear and moist. No oropharyngeal exudate.  Normal TM's bilaterally.   Eyes: Conjunctivae are normal. No scleral icterus.  Neck: Neck supple. No thyromegaly present.  Cardiovascular: Normal rate and regular rhythm.   No murmur heard. Pulmonary/Chest: Effort normal and breath sounds normal. She has no wheezes. She has no rales.  Abdominal: Soft. She exhibits no distension. There is no tenderness. There is no rebound and no guarding.  Musculoskeletal: Normal range of motion. She exhibits no edema.  Lymphadenopathy:    She has no cervical adenopathy.  Neurological: She is alert and oriented to person, place, and time.  Skin: Skin is warm and dry. No rash noted.  Psychiatric: She has a normal mood and affect.  Vitals reviewed.  Lab Results  Component Value Date   WBC 5.7 06/15/2014   HGB 13.8 06/15/2014   HCT 40.7 06/15/2014   PLT 229 06/15/2014   GLUCOSE 98 06/15/2014   CHOL 167 02/28/2014   TRIG 38.0 02/28/2014   HDL 60.20 02/28/2014   LDLCALC 99 02/28/2014   ALT  10 06/15/2014   AST 13 06/15/2014   NA 137 06/15/2014   K 5.0 06/15/2014   CL 103 06/15/2014   CREATININE 0.98 06/15/2014   BUN 14 06/15/2014   CO2 27 06/15/2014   TSH 0.43 02/28/2014   Assessment & Plan:   Problem List Items Addressed This Visit    Preventative health care - Primary    Immunizations, Pap smear, mammogram up-to-date. Patient declined flu shot today. Remainder of preventative healthcare up-to-date. Obtaining labs today: CBC, CMP, lipid, TSH, vitamin D.      History of vertigo    Other Visit Diagnoses    Screening for lipid disorders        Relevant Orders    Lipid panel    Screening for deficiency anemia        Relevant Orders    CBC    Hair thinning        Relevant Orders     Comp Met (CMET)    TSH    Encounter for vitamin deficiency screening        Relevant Orders    Vitamin D (25 hydroxy)      Follow-up: Annually.  Thersa Salt, DO

## 2015-07-03 NOTE — Assessment & Plan Note (Signed)
Immunizations, Pap smear, mammogram up-to-date. Patient declined flu shot today. Remainder of preventative healthcare up-to-date. Obtaining labs today: CBC, CMP, lipid, TSH, vitamin D.

## 2015-07-03 NOTE — Patient Instructions (Signed)
It was nice to see you today.  Follow up annually or sooner if needed.   We will call with your lab results.  Take care  Dr. Lacinda Axon

## 2015-07-03 NOTE — Progress Notes (Signed)
Pre visit review using our clinic review tool, if applicable. No additional management support is needed unless otherwise documented below in the visit note. 

## 2016-07-24 ENCOUNTER — Encounter: Payer: BC Managed Care – PPO | Admitting: Family Medicine

## 2016-08-05 ENCOUNTER — Encounter: Payer: Self-pay | Admitting: Family Medicine

## 2016-08-05 ENCOUNTER — Ambulatory Visit (INDEPENDENT_AMBULATORY_CARE_PROVIDER_SITE_OTHER): Payer: BC Managed Care – PPO | Admitting: Family Medicine

## 2016-08-05 VITALS — BP 116/60 | HR 83 | Temp 98.7°F | Resp 12 | Ht 69.0 in | Wt 138.1 lb

## 2016-08-05 DIAGNOSIS — Z0000 Encounter for general adult medical examination without abnormal findings: Secondary | ICD-10-CM | POA: Diagnosis not present

## 2016-08-05 DIAGNOSIS — Z1231 Encounter for screening mammogram for malignant neoplasm of breast: Secondary | ICD-10-CM | POA: Diagnosis not present

## 2016-08-05 DIAGNOSIS — Z1239 Encounter for other screening for malignant neoplasm of breast: Secondary | ICD-10-CM

## 2016-08-05 LAB — COMPREHENSIVE METABOLIC PANEL
ALT: 12 U/L (ref 0–35)
AST: 14 U/L (ref 0–37)
Albumin: 4.4 g/dL (ref 3.5–5.2)
Alkaline Phosphatase: 40 U/L (ref 39–117)
BILIRUBIN TOTAL: 0.4 mg/dL (ref 0.2–1.2)
BUN: 15 mg/dL (ref 6–23)
CALCIUM: 9.7 mg/dL (ref 8.4–10.5)
CO2: 30 meq/L (ref 19–32)
Chloride: 103 mEq/L (ref 96–112)
Creatinine, Ser: 0.9 mg/dL (ref 0.40–1.20)
GFR: 70.91 mL/min (ref >60.00)
Glucose, Bld: 106 mg/dL — ABNORMAL HIGH (ref 70–99)
Potassium: 4.8 mEq/L (ref 3.5–5.1)
Sodium: 138 mEq/L (ref 135–145)
Total Protein: 6.6 g/dL (ref 6.0–8.3)

## 2016-08-05 LAB — LIPID PANEL
CHOL/HDL RATIO: 2
CHOLESTEROL: 170 mg/dL (ref 0–200)
HDL: 68.9 mg/dL (ref >39.00)
LDL Cholesterol: 93 mg/dL (ref 0–99)
NonHDL: 100.69
TRIGLYCERIDES: 38 mg/dL (ref 0.0–149.0)
VLDL: 7.6 mg/dL (ref 0.0–40.0)

## 2016-08-05 LAB — CBC
HCT: 39.7 % (ref 36.0–46.0)
HEMOGLOBIN: 13.4 g/dL (ref 12.0–15.0)
MCHC: 33.8 g/dL (ref 30.0–36.0)
MCV: 93.6 fl (ref 78.0–100.0)
PLATELETS: 189 10*3/uL (ref 150.0–400.0)
RBC: 4.24 Mil/uL (ref 3.87–5.11)
RDW: 13.3 % (ref 11.5–15.5)
WBC: 7.3 10*3/uL (ref 4.0–10.5)

## 2016-08-05 NOTE — Assessment & Plan Note (Signed)
Preventative healthcare up-to-date other than mammogram. Mammogram ordered. Labs today.

## 2016-08-05 NOTE — Progress Notes (Signed)
Subjective:  Patient ID: Kara Spencer, female    DOB: 07-Jan-1966  Age: 50 y.o. MRN: [id number]  CC: Annual exam.   HPI Kara Spencer is a 50 y.o. female presents to the clinic today for an annual physical exam.  Preventative Healthcare  Pap smear: Up to date. Sees GYN.  Mammogram: In need of.  Colonoscopy: Not indicated.   Immunizations  Tetanus - Up to date.   Pneumococcal - Not indicated.   Flu - Up to date.  Labs: Needs labs today.   Alcohol use: See below.  Smoking/tobacco use: Nonsmoker.  PMH, Surgical Hx, Family Hx, Social History reviewed and updated as below.  Past Medical History:  Diagnosis Date  . Gluten intolerance    Wheat, caicin  . History of chicken pox    Past Surgical History:  Procedure Laterality Date  . TONSILLECTOMY AND ADENOIDECTOMY  1973   Family History  Problem Relation Age of Onset  . Lung cancer Mother   . Stroke Mother   . Cancer Mother     lung cancer  . Lung cancer Father   . Cancer Father     lung cancer - smoker  . Cancer Paternal Aunt     ovarian cancer  . Early death Sister     automobile accident  . Heart disease Paternal Grandfather   . Thyroid disease Maternal Grandmother    Social History  Substance Use Topics  . Smoking status: Never Smoker  . Smokeless tobacco: Never Used  . Alcohol use Yes     Comment: 2-3 times a month   Review of Systems  Musculoskeletal:       Left buttock pain; anterior L leg pain.  All other systems reviewed and are negative.  Objective:   Today's Vitals: BP 116/60 (BP Location: Left Arm, Patient Position: Sitting, Cuff Size: Normal)   Pulse 83   Temp 98.7 F (37.1 C) (Oral)   Resp 12   Ht 5\' 9"  (1.753 m)   Wt 138 lb 2 oz (62.7 kg)   SpO2 98%   BMI 20.40 kg/m   Physical Exam  Constitutional: She is oriented to person, place, and time. She appears well-developed and well-nourished. No distress.  HENT:  Head: Normocephalic and atraumatic.  Nose: Nose normal.    Mouth/Throat: Oropharynx is clear and moist. No oropharyngeal exudate.  Normal TM's bilaterally.   Eyes: Conjunctivae are normal. No scleral icterus.  Neck: Neck supple.  Cardiovascular: Normal rate and regular rhythm.   No murmur heard. Pulmonary/Chest: Effort normal and breath sounds normal. She has no wheezes. She has no rales.  Abdominal: Soft. She exhibits no distension. There is no tenderness. There is no rebound and no guarding.  Musculoskeletal: Normal range of motion. She exhibits no edema.  Lymphadenopathy:    She has no cervical adenopathy.  Neurological: She is alert and oriented to person, place, and time.  Skin: Skin is warm and dry. No rash noted.  Psychiatric: She has a normal mood and affect.  Vitals reviewed.  Assessment & Plan:   Problem List Items Addressed This Visit    Annual physical exam - Primary    Preventative healthcare up-to-date other than mammogram. Mammogram ordered. Labs today.      Relevant Orders   CBC   Comprehensive metabolic panel   Lipid panel    Other Visit Diagnoses    Screening for breast cancer       Relevant Orders   MM DIGITAL SCREENING BILATERAL  Outpatient Encounter Prescriptions as of 08/05/2016  Medication Sig  . Ascorbic Acid (VITAMIN C) 1000 MG tablet Take 1,000 mg by mouth 3 (three) times daily.  . ASTAXANTHIN PO Take 12 mg by mouth daily.  . B Complex-C (B-COMPLEX WITH VITAMIN C) tablet Take 1 tablet by mouth daily.  Marland Kitchen DIGESTIVE ENZYMES PO Take 1 tablet by mouth 3 (three) times daily.  . Misc Natural Products (CHLORELLA) 500 MG CAPS Take 500 mg by mouth daily.  . Multiple Vitamin (MULTIVITAMIN) capsule Take 1 capsule by mouth 2 (two) times daily.   . Nutritional Supplements (PYCNOGENOL) 30 MG CAPS Take 1 capsule by mouth daily.  . Omega-3 Fatty Acids (OMEGA 3 PO) Take 800 mg by mouth. Liquid form. At bedtime  . Probiotic Product (PROBIOTIC DAILY PO) Take 1 tablet by mouth daily.  Marland Kitchen VITAMIN D, CHOLECALCIFEROL, PO  Take 5,000 Units by mouth.   No facility-administered encounter medications on file as of 08/05/2016.     Follow-up: Return in about 1 year (around 08/05/2017).  [place]

## 2016-08-05 NOTE — Patient Instructions (Signed)
You're doing well.  Follow up annually.  Take care  Dr. Lacinda Axon  Health Maintenance, Female Introduction Adopting a healthy lifestyle and getting preventive care can go a long way to promote health and wellness. Talk with your health care provider about what schedule of regular examinations is right for you. This is a good chance for you to check in with your provider about disease prevention and staying healthy. In between checkups, there are plenty of things you can do on your own. Experts have done a lot of research about which lifestyle changes and preventive measures are most likely to keep you healthy. Ask your health care provider for more information. Weight and diet Eat a healthy diet  Be sure to include plenty of vegetables, fruits, low-fat dairy products, and lean protein.  Do not eat a lot of foods high in solid fats, added sugars, or salt.  Get regular exercise. This is one of the most important things you can do for your health.  Most adults should exercise for at least 150 minutes each week. The exercise should increase your heart rate and make you sweat (moderate-intensity exercise).  Most adults should also do strengthening exercises at least twice a week. This is in addition to the moderate-intensity exercise. Maintain a healthy weight  Body mass index (BMI) is a measurement that can be used to identify possible weight problems. It estimates body fat based on height and weight. Your health care provider can help determine your BMI and help you achieve or maintain a healthy weight.  For females 33 years of age and older:  A BMI below 18.5 is considered underweight.  A BMI of 18.5 to 24.9 is normal.  A BMI of 25 to 29.9 is considered overweight.  A BMI of 30 and above is considered obese. Watch levels of cholesterol and blood lipids  You should start having your blood tested for lipids and cholesterol at 48 years of age, then have this test every 5 years.  You  may need to have your cholesterol levels checked more often if:  Your lipid or cholesterol levels are high.  You are older than 48 years of age.  You are at high risk for heart disease. Cancer screening Lung Cancer  Lung cancer screening is recommended for adults 37-5 years old who are at high risk for lung cancer because of a history of smoking.  A yearly low-dose CT scan of the lungs is recommended for people who:  Currently smoke.  Have quit within the past 15 years.  Have at least a 30-pack-year history of smoking. A pack year is smoking an average of one pack of cigarettes a day for 1 year.  Yearly screening should continue until it has been 15 years since you quit.  Yearly screening should stop if you develop a health problem that would prevent you from having lung cancer treatment. Breast Cancer  Practice breast self-awareness. This means understanding how your breasts normally appear and feel.  It also means doing regular breast self-exams. Let your health care provider know about any changes, no matter how small.  If you are in your 20s or 30s, you should have a clinical breast exam (CBE) by a health care provider every 1-3 years as part of a regular health exam.  If you are 49 or older, have a CBE every year. Also consider having a breast X-ray (mammogram) every year.  If you have a family history of breast cancer, talk to your health care  provider about genetic screening.  If you are at high risk for breast cancer, talk to your health care provider about having an MRI and a mammogram every year.  Breast cancer gene (BRCA) assessment is recommended for women who have family members with BRCA-related cancers. BRCA-related cancers include:  Breast.  Ovarian.  Tubal.  Peritoneal cancers.  Results of the assessment will determine the need for genetic counseling and BRCA1 and BRCA2 testing. Cervical Cancer  Your health care provider may recommend that you be  screened regularly for cancer of the pelvic organs (ovaries, uterus, and vagina). This screening involves a pelvic examination, including checking for microscopic changes to the surface of your cervix (Pap test). You may be encouraged to have this screening done every 3 years, beginning at age 37.  For women ages 68-65, health care providers may recommend pelvic exams and Pap testing every 3 years, or they may recommend the Pap and pelvic exam, combined with testing for human papilloma virus (HPV), every 5 years. Some types of HPV increase your risk of cervical cancer. Testing for HPV may also be done on women of any age with unclear Pap test results.  Other health care providers may not recommend any screening for nonpregnant women who are considered low risk for pelvic cancer and who do not have symptoms. Ask your health care provider if a screening pelvic exam is right for you.  If you have had past treatment for cervical cancer or a condition that could lead to cancer, you need Pap tests and screening for cancer for at least 20 years after your treatment. If Pap tests have been discontinued, your risk factors (such as having a new sexual partner) need to be reassessed to determine if screening should resume. Some women have medical problems that increase the chance of getting cervical cancer. In these cases, your health care provider may recommend more frequent screening and Pap tests. Colorectal Cancer  This type of cancer can be detected and often prevented.  Routine colorectal cancer screening usually begins at 48 years of age and continues through 48 years of age.  Your health care provider may recommend screening at an earlier age if you have risk factors for colon cancer.  Your health care provider may also recommend using home test kits to check for hidden blood in the stool.  A small camera at the end of a tube can be used to examine your colon directly (sigmoidoscopy or colonoscopy).  This is done to check for the earliest forms of colorectal cancer.  Routine screening usually begins at age 32.  Direct examination of the colon should be repeated every 5-10 years through 48 years of age. However, you may need to be screened more often if early forms of precancerous polyps or small growths are found. Skin Cancer  Check your skin from head to toe regularly.  Tell your health care provider about any new moles or changes in moles, especially if there is a change in a mole's shape or color.  Also tell your health care provider if you have a mole that is larger than the size of a pencil eraser.  Always use sunscreen. Apply sunscreen liberally and repeatedly throughout the day.  Protect yourself by wearing long sleeves, pants, a wide-brimmed hat, and sunglasses whenever you are outside. Heart disease, diabetes, and high blood pressure  High blood pressure causes heart disease and increases the risk of stroke. High blood pressure is more likely to develop in:  People who  have blood pressure in the high end of the normal range (130-139/85-89 mm Hg).  People who are overweight or obese.  People who are African American.  If you are 70-58 years of age, have your blood pressure checked every 3-5 years. If you are 37 years of age or older, have your blood pressure checked every year. You should have your blood pressure measured twice-once when you are at a hospital or clinic, and once when you are not at a hospital or clinic. Record the average of the two measurements. To check your blood pressure when you are not at a hospital or clinic, you can use:  An automated blood pressure machine at a pharmacy.  A home blood pressure monitor.  If you are between 22 years and 62 years old, ask your health care provider if you should take aspirin to prevent strokes.  Have regular diabetes screenings. This involves taking a blood sample to check your fasting blood sugar level.  If you  are at a normal weight and have a low risk for diabetes, have this test once every three years after 48 years of age.  If you are overweight and have a high risk for diabetes, consider being tested at a younger age or more often. Preventing infection Hepatitis B  If you have a higher risk for hepatitis B, you should be screened for this virus. You are considered at high risk for hepatitis B if:  You were born in a country where hepatitis B is common. Ask your health care provider which countries are considered high risk.  Your parents were born in a high-risk country, and you have not been immunized against hepatitis B (hepatitis B vaccine).  You have HIV or AIDS.  You use needles to inject street drugs.  You live with someone who has hepatitis B.  You have had sex with someone who has hepatitis B.  You get hemodialysis treatment.  You take certain medicines for conditions, including cancer, organ transplantation, and autoimmune conditions. Hepatitis C  Blood testing is recommended for:  Everyone born from [birth year] through 1963.  Anyone with known risk factors for hepatitis C. Sexually transmitted infections (STIs)  You should be screened for sexually transmitted infections (STIs) including gonorrhea and chlamydia if:  You are sexually active and are younger than 48 years of age.  You are older than 48 years of age and your health care provider tells you that you are at risk for this type of infection.  Your sexual activity has changed since you were last screened and you are at an increased risk for chlamydia or gonorrhea. Ask your health care provider if you are at risk.  If you do not have HIV, but are at risk, it may be recommended that you take a prescription medicine daily to prevent HIV infection. This is called pre-exposure prophylaxis (PrEP). You are considered at risk if:  You are sexually active and do not regularly use condoms or know the HIV status of your  partner(s).  You take drugs by injection.  You are sexually active with a partner who has HIV. Talk with your health care provider about whether you are at high risk of being infected with HIV. If you choose to begin PrEP, you should first be tested for HIV. You should then be tested every 3 months for as long as you are taking PrEP. Pregnancy  If you are premenopausal and you may become pregnant, ask your health care provider about preconception counseling.  If you may become pregnant, take 400 to 800 micrograms (mcg) of folic acid every day.  If you want to prevent pregnancy, talk to your health care provider about birth control (contraception). Osteoporosis and menopause  Osteoporosis is a disease in which the bones lose minerals and strength with aging. This can result in serious bone fractures. Your risk for osteoporosis can be identified using a bone density scan.  If you are 8 years of age or older, or if you are at risk for osteoporosis and fractures, ask your health care provider if you should be screened.  Ask your health care provider whether you should take a calcium or vitamin D supplement to lower your risk for osteoporosis.  Menopause may have certain physical symptoms and risks.  Hormone replacement therapy may reduce some of these symptoms and risks. Talk to your health care provider about whether hormone replacement therapy is right for you. Follow these instructions at home:  Schedule regular health, dental, and eye exams.  Stay current with your immunizations.  Do not use any tobacco products including cigarettes, chewing tobacco, or electronic cigarettes.  If you are pregnant, do not drink alcohol.  If you are breastfeeding, limit how much and how often you drink alcohol.  Limit alcohol intake to no more than 1 drink per day for nonpregnant women. One drink equals 12 ounces of beer, 5 ounces of wine, or 1 ounces of hard liquor.  Do not use street  drugs.  Do not share needles.  Ask your health care provider for help if you need support or information about quitting drugs.  Tell your health care provider if you often feel depressed.  Tell your health care provider if you have ever been abused or do not feel safe at home. This information is not intended to replace advice given to you by your health care provider. Make sure you discuss any questions you have with your health care provider. Document Released: 03/23/2011 Document Revised: 02/13/2016 Document Reviewed: 06/11/2015  2017 Elsevier

## 2016-08-05 NOTE — Progress Notes (Signed)
Pre visit review using our clinic review tool, if applicable. No additional management support is needed unless otherwise documented below in the visit note. 

## 2016-09-16 ENCOUNTER — Encounter: Payer: Self-pay | Admitting: Family Medicine

## 2016-10-14 ENCOUNTER — Ambulatory Visit: Payer: BC Managed Care – PPO

## 2017-04-01 ENCOUNTER — Ambulatory Visit (INDEPENDENT_AMBULATORY_CARE_PROVIDER_SITE_OTHER): Payer: BC Managed Care – PPO | Admitting: Family Medicine

## 2017-04-01 ENCOUNTER — Encounter: Payer: Self-pay | Admitting: Family Medicine

## 2017-04-01 DIAGNOSIS — J988 Other specified respiratory disorders: Secondary | ICD-10-CM | POA: Diagnosis not present

## 2017-04-01 LAB — POCT RAPID STREP A (OFFICE): RAPID STREP A SCREEN: NEGATIVE

## 2017-04-01 MED ORDER — DOXYCYCLINE HYCLATE 100 MG PO TABS: 100.0000 mg | ORAL_TABLET | Freq: Two times a day (BID) | ORAL | 0 refills | Status: DC

## 2017-04-01 NOTE — Assessment & Plan Note (Signed)
New acute problem. This is likely viral in origin. Patient seems to be improving. I advised her to continue ibuprofen and Tylenol as needed. Mucinex as needed as well. Given the duration of her illness, I gave her an antibiotic to be filled if she worsens again or feels like she is not continuing to improve.

## 2017-04-01 NOTE — Patient Instructions (Addendum)
This is likely viral/post viral.  If you worsen or fail to improve, start the antibiotic.  Take care  Dr. Lacinda Axon

## 2017-04-01 NOTE — Progress Notes (Signed)
Subjective:  Patient ID: Kara Spencer, female    DOB: 11-Sep-1966  Age: 51 y.o. MRN: [id number]  CC: Sore throat, ear fullness, cough, lymphadenopathy  HPI:  51 year old female presents with the above complaints.  Patient reports that she's been sick since the beginning of the month. She's had sore throat, productive cough, ear fullness, and "swollen glands". No reported fever. No chills. She been using over-the-counter medications including Mucinex, ibuprofen, and Tylenol with some improvement. She states that she has been slowly worsening. However, she feels slightly better today. No known exacerbating factors. No other associated symptoms. No other complaints or concerns at this time.  Social Hx   Social History   Social History  . Marital status: Married    Spouse name: N/A  . Number of children: 1  . Years of education: 11   Occupational History  . Speech Language Pathologist [employer]   Social History Main Topics  . Smoking status: Never Smoker  . Smokeless tobacco: Never Used  . Alcohol use Yes     Comment: 2-3 times a month  . Drug use: No  . Sexual activity: Yes   Other Topics Concern  . None   Social History Narrative   Kara Spencer grew up in [place]. She attended [employer] and obtained her Paediatric nurse in Communication Disorders. She then obtained her Masters in Sport and exercise psychologist from [employer]. She currently lives in [place] with her husband and daughter.    Review of Systems  Constitutional: Negative for fever.  HENT: Positive for ear pain, postnasal drip and sore throat.   Respiratory: Positive for cough.    Objective:  BP 108/70 (BP Location: Left Arm, Patient Position: Sitting, Cuff Size: Normal)   Pulse 88   Temp 99.1 F (37.3 C) (Oral)   Wt 134 lb (60.8 kg)   SpO2 98%   BMI 19.79 kg/m   BP/Weight 04/01/2017 08/05/2016 27/11/5007  Systolic BP 381 829 937  Diastolic BP 70 60 62  Wt. (Lbs) 134 138.13  132.8  BMI 19.79 20.4 19.6   Physical Exam  Constitutional: She is oriented to person, place, and time. She appears well-developed. No distress.  HENT:  Head: Normocephalic and atraumatic.  Normal TMs bilaterally. Oropharynx with mild erythema. Evidence of postnasal drip.  Eyes: Conjunctivae are normal. No scleral icterus.  Neck: Neck supple.  Cardiovascular: Normal rate and regular rhythm.   No murmur heard. Pulmonary/Chest: Effort normal and breath sounds normal. She has no wheezes. She has no rales.  Lymphadenopathy:    She has cervical adenopathy.  Neurological: She is alert and oriented to person, place, and time.  Psychiatric: She has a normal mood and affect.  Vitals reviewed.   Lab Results  Component Value Date   WBC 7.3 08/05/2016   HGB 13.4 08/05/2016   HCT 39.7 08/05/2016   PLT 189.0 08/05/2016   GLUCOSE 106 (H) 08/05/2016   CHOL 170 08/05/2016   TRIG 38.0 08/05/2016   HDL 68.90 08/05/2016   LDLCALC 93 08/05/2016   ALT 12 08/05/2016   AST 14 08/05/2016   NA 138 08/05/2016   K 4.8 08/05/2016   CL 103 08/05/2016   CREATININE 0.90 08/05/2016   BUN 15 08/05/2016   CO2 30 08/05/2016   TSH 0.92 07/03/2015    Assessment & Plan:   Problem List Items Addressed This Visit      Respiratory   Respiratory infection    New acute problem. This is likely viral in  origin. Patient seems to be improving. I advised her to continue ibuprofen and Tylenol as needed. Mucinex as needed as well. Given the duration of her illness, I gave her an antibiotic to be filled if she worsens again or feels like she is not continuing to improve.      Relevant Orders   POCT rapid strep A (Completed)      Meds ordered this encounter  Medications  . doxycycline (VIBRA-TABS) 100 MG tablet    Sig: Take 1 tablet (100 mg total) by mouth 2 (two) times daily.    Dispense:  14 tablet    Refill:  0   Follow-up: PRN  [place]

## 2017-08-17 ENCOUNTER — Encounter: Payer: Self-pay | Admitting: Internal Medicine

## 2017-08-17 ENCOUNTER — Ambulatory Visit (INDEPENDENT_AMBULATORY_CARE_PROVIDER_SITE_OTHER): Payer: BC Managed Care – PPO

## 2017-08-17 ENCOUNTER — Encounter: Payer: BC Managed Care – PPO | Admitting: Internal Medicine

## 2017-08-17 ENCOUNTER — Ambulatory Visit (INDEPENDENT_AMBULATORY_CARE_PROVIDER_SITE_OTHER): Payer: BC Managed Care – PPO | Admitting: Internal Medicine

## 2017-08-17 VITALS — BP 98/60 | HR 76 | Temp 98.9°F | Ht 69.0 in | Wt 141.1 lb

## 2017-08-17 DIAGNOSIS — J8410 Pulmonary fibrosis, unspecified: Secondary | ICD-10-CM

## 2017-08-17 DIAGNOSIS — Z1159 Encounter for screening for other viral diseases: Secondary | ICD-10-CM | POA: Diagnosis not present

## 2017-08-17 DIAGNOSIS — M79622 Pain in left upper arm: Secondary | ICD-10-CM

## 2017-08-17 DIAGNOSIS — G629 Polyneuropathy, unspecified: Secondary | ICD-10-CM | POA: Diagnosis not present

## 2017-08-17 LAB — COMPREHENSIVE METABOLIC PANEL
ALT: 18 U/L (ref 0–35)
AST: 18 U/L (ref 0–37)
Albumin: 4.5 g/dL (ref 3.5–5.2)
Alkaline Phosphatase: 47 U/L (ref 39–117)
BUN: 15 mg/dL (ref 6–23)
CHLORIDE: 101 meq/L (ref 96–112)
CO2: 30 meq/L (ref 19–32)
Calcium: 10.1 mg/dL (ref 8.4–10.5)
Creatinine, Ser: 0.9 mg/dL (ref 0.40–1.20)
GFR: 70.61 mL/min (ref >60.00)
GLUCOSE: 97 mg/dL (ref 70–99)
POTASSIUM: 4.2 meq/L (ref 3.5–5.1)
SODIUM: 138 meq/L (ref 135–145)
Total Bilirubin: 0.4 mg/dL (ref 0.2–1.2)
Total Protein: 7.3 g/dL (ref 6.0–8.3)

## 2017-08-17 LAB — URINALYSIS, ROUTINE W REFLEX MICROSCOPIC
Bilirubin Urine: NEGATIVE
HGB URINE DIPSTICK: NEGATIVE
Ketones, ur: NEGATIVE
LEUKOCYTES UA: NEGATIVE
NITRITE: NEGATIVE
RBC / HPF: NONE SEEN (ref 0–?)
SPECIFIC GRAVITY, URINE: 1.01 (ref 1.000–1.030)
Total Protein, Urine: NEGATIVE
URINE GLUCOSE: NEGATIVE
Urobilinogen, UA: 0.2 (ref 0.0–1.0)
WBC UA: NONE SEEN (ref 0–?)
pH: 7 (ref 5.0–8.0)

## 2017-08-17 LAB — VITAMIN B12: Vitamin B-12: 1396 pg/mL — ABNORMAL HIGH (ref 211–911)

## 2017-08-17 LAB — TSH: TSH: 1.25 u[IU]/mL (ref 0.35–4.50)

## 2017-08-17 LAB — T4, FREE: FREE T4: 0.9 ng/dL (ref 0.60–1.60)

## 2017-08-17 NOTE — Patient Instructions (Signed)
We will check labs and Chest Xray today  And order Ultrasound left arm for future  F/u in 2-3 weeks   Peripheral Neuropathy Peripheral neuropathy is a type of nerve damage. It affects nerves that carry signals between the spinal cord and other parts of the body. These are called peripheral nerves. With peripheral neuropathy, one nerve or a group of nerves may be damaged. What are the causes? Many things can damage peripheral nerves. For some people with peripheral neuropathy, the cause is unknown. Some causes include:  Diabetes. This is the most common cause of peripheral neuropathy.  Injury to a nerve.  Pressure or stress on a nerve that lasts a long time.  Too little vitamin B. Alcoholism can lead to this.  Infections.  Autoimmune diseases, such as multiple sclerosis and systemic lupus erythematosus.  Inherited nerve diseases.  Some medicines, such as cancer drugs.  Toxic substances, such as lead and mercury.  Too little blood flowing to the legs.  Kidney disease.  Thyroid disease.  What are the signs or symptoms? Different people have different symptoms. The symptoms you have will depend on which of your nerves is damaged. Common symptoms include:  Loss of feeling (numbness) in the feet and hands.  Tingling in the feet and hands.  Pain that burns.  Very sensitive skin.  Weakness.  Not being able to move a part of the body (paralysis).  Muscle twitching.  Clumsiness or poor coordination.  Loss of balance.  Not being able to control your bladder.  Feeling dizzy.  Sexual problems.  How is this diagnosed? Peripheral neuropathy is a symptom, not a disease. Finding the cause of peripheral neuropathy can be hard. To figure that out, your health care provider will take a medical history and do a physical exam. A neurological exam will also be done. This involves checking things affected by your brain, spinal cord, and nerves (nervous system). For example, your  health care provider will check your reflexes, how you move, and what you can feel. Other types of tests may also be ordered, such as:  Blood tests.  A test of the fluid in your spinal cord.  Imaging tests, such as CT scans or an MRI.  Electromyography (EMG). This test checks the nerves that control muscles.  Nerve conduction velocity tests. These tests check how fast messages pass through your nerves.  Nerve biopsy. A small piece of nerve is removed. It is then checked under a microscope.  How is this treated?  Medicine is often used to treat peripheral neuropathy. Medicines may include: ? Pain-relieving medicines. Prescription or over-the-counter medicine may be suggested. ? Antiseizure medicine. This may be used for pain. ? Antidepressants. These also may help ease pain from neuropathy. ? Lidocaine. This is a numbing medicine. You might wear a patch or be given a shot. ? Mexiletine. This medicine is typically used to help control irregular heart rhythms.  Surgery. Surgery may be needed to relieve pressure on a nerve or to destroy a nerve that is causing pain.  Physical therapy to help movement.  Assistive devices to help movement. Follow these instructions at home:  Only take over-the-counter or prescription medicines as directed by your health care provider. Follow the instructions carefully for any given medicines. Do not take any other medicines without first getting approval from your health care provider.  If you have diabetes, work closely with your health care provider to keep your blood sugar under control.  If you have numbness in your  feet: ? Check every day for signs of injury or infection. Watch for redness, warmth, and swelling. ? Wear padded socks and comfortable shoes. These help protect your feet.  Do not do things that put pressure on your damaged nerve.  Do not smoke. Smoking keeps blood from getting to damaged nerves.  Avoid or limit alcohol. Too much  alcohol can cause a lack of B vitamins. These vitamins are needed for healthy nerves.  Develop a good support system. Coping with peripheral neuropathy can be stressful. Talk to a mental health specialist or join a support group if you are struggling.  Follow up with your health care provider as directed. Contact a health care provider if:  You have new signs or symptoms of peripheral neuropathy.  You are struggling emotionally from dealing with peripheral neuropathy.  You have a fever. Get help right away if:  You have an injury or infection that is not healing.  You feel very dizzy or begin vomiting.  You have chest pain.  You have trouble breathing. This information is not intended to replace advice given to you by your health care provider. Make sure you discuss any questions you have with your health care provider. Document Released: 08/28/2002 Document Revised: 02/13/2016 Document Reviewed: 05/15/2013 Elsevier Interactive Patient Education  2017 [employer].

## 2017-08-18 ENCOUNTER — Encounter: Payer: Self-pay | Admitting: Internal Medicine

## 2017-08-18 DIAGNOSIS — Z1159 Encounter for screening for other viral diseases: Secondary | ICD-10-CM | POA: Insufficient documentation

## 2017-08-18 DIAGNOSIS — G629 Polyneuropathy, unspecified: Secondary | ICD-10-CM | POA: Insufficient documentation

## 2017-08-18 DIAGNOSIS — J8410 Pulmonary fibrosis, unspecified: Secondary | ICD-10-CM

## 2017-08-18 DIAGNOSIS — M79622 Pain in left upper arm: Secondary | ICD-10-CM | POA: Insufficient documentation

## 2017-08-18 HISTORY — DX: Pulmonary fibrosis, unspecified: J84.10

## 2017-08-18 HISTORY — DX: Polyneuropathy, unspecified: G62.9

## 2017-08-18 HISTORY — DX: Encounter for screening for other viral diseases: Z11.59

## 2017-08-18 HISTORY — DX: Pain in left upper arm: M79.622

## 2017-08-18 LAB — HEPATITIS B CORE ANTIBODY, TOTAL: Hep B Core Total Ab: NONREACTIVE

## 2017-08-18 LAB — ANA: ANA: NEGATIVE

## 2017-08-18 LAB — CBC
HCT: 41.3 % (ref 35.0–45.0)
Hemoglobin: 14.2 g/dL (ref 11.7–15.5)
MCH: 31.3 pg (ref 27.0–33.0)
MCHC: 34.4 g/dL (ref 32.0–36.0)
MCV: 91 fL (ref 80.0–100.0)
MPV: 12.3 fL (ref 7.5–12.5)
PLATELETS: 216 10*3/uL (ref 140–400)
RBC: 4.54 10*6/uL (ref 3.80–5.10)
RDW: 11.8 % (ref 11.0–15.0)
WBC: 5.7 10*3/uL (ref 3.8–10.8)

## 2017-08-18 LAB — HEPATITIS C ANTIBODY
Hepatitis C Ab: NONREACTIVE
SIGNAL TO CUT-OFF: 0.01 (ref <1.00)

## 2017-08-18 LAB — HEPATITIS B SURFACE ANTIBODY,QUALITATIVE: Hep B S Ab: NONREACTIVE

## 2017-08-18 LAB — HEPATITIS B SURFACE ANTIGEN: HEP B S AG: NONREACTIVE

## 2017-08-18 NOTE — Progress Notes (Signed)
Chief Complaint  Patient presents with  . Annual Exam   Pt presents for f/u establish care  1. She reports she is taking a lot of supplements per Dr. Carney Living and due to h/o blood testing and allergies to substances. Advised pt I do not know a lot about these supplements and the side effects and risk of liver failure if not FDA approved. Rec she follow with homeopathic MD in future or Dr. Carney Living but someone who knows about these supplements  2 .She c/o left axilla pain intermittently x 1-2 months nothing worse or better. No lump. At times pain is 6/10. She feels at times she has had associated sob and sob esp with eating. Sx's have overall gone on x ~7 years and she reports mentioning to 2 other providers in the past. Reviewed CXR 2015 with +granulomatous disease but pt states this was not explained to her. Denies wt changes. But states at times she is waking up at night sweating but she is unsure if due to age related changes.  3. She c/o intermittent numbness in b/l extremities upper and lower at different times and position she is in does not affect/provoke this. Denies vision problems or loss of vision  4. She reports husband just dx'ed with flu and her voice was gone until 2 days ago but she did not have other flu sx's.    Review of Systems  Constitutional: Negative for fever and weight loss.       +sweating at night at times   HENT:       +recently lost voice but normal now   Eyes:       Denies vision problems loss of vision   Respiratory: Positive for shortness of breath.   Cardiovascular: Negative for chest pain.  Gastrointestinal: Negative for abdominal pain.  Skin:       Mole just bx'ed Dr. Nicole Kindred   Neurological: Positive for sensory change.   Past Medical History:  Diagnosis Date  . Dysplastic nevi    s/p removal 07/30/17 and exc sch 10/04/17 trunk  . Gluten intolerance    Wheat, caicin  . History of chicken pox    Past Surgical History:  Procedure Laterality Date  .  TONSILLECTOMY AND ADENOIDECTOMY  1973   Family History  Problem Relation Age of Onset  . Lung cancer Mother   . Stroke Mother   . Cancer Mother        lung cancer  . Lung cancer Father   . Cancer Father        lung cancer - smoker  . Cancer Paternal Aunt        ovarian cancer  . Early death Sister        automobile accident  . Heart disease Paternal Grandfather   . Thyroid disease Maternal Grandmother    Social History   Socioeconomic History  . Marital status: Married    Spouse name: Not on file  . Number of children: 1  . Years of education: 19  . Highest education level: Not on file  Social Needs  . Financial resource strain: Not on file  . Food insecurity - worry: Not on file  . Food insecurity - inability: Not on file  . Transportation needs - medical: Not on file  . Transportation needs - non-medical: Not on file  Occupational History  . Occupation: Market researcher: [employer]  Tobacco Use  . Smoking status: Never Smoker  . Smokeless tobacco: Never  Used  Substance and Sexual Activity  . Alcohol use: Yes    Comment: 2-3 times a month  . Drug use: No  . Sexual activity: Yes  Other Topics Concern  . Not on file  Social History Narrative   Armentha grew up in [place]. She attended [employer] and obtained her Paediatric nurse in Communication Disorders. She then obtained her Masters in Sport and exercise psychologist from [employer]. She currently lives in [place] with her husband and daughter (as of 07/2017 daughter is 7.5 y.o)   Current Meds  Medication Sig  . Ascorbic Acid (VITAMIN C) 1000 MG tablet Take 1,000 mg by mouth 3 (three) times daily.  . ASTAXANTHIN PO Take 12 mg by mouth daily.  . B Complex-C (B-COMPLEX WITH VITAMIN C) tablet Take 1 tablet by mouth daily.  . Calcium-Magnesium-Vitamin D (CALCIUM MAGNESIUM PO) Take by mouth daily.  . Misc Natural Products (CHLORELLA) 500 MG CAPS Take 500 mg by mouth daily.   . Multiple Vitamin (MULTIVITAMIN) capsule Take 1 capsule by mouth 2 (two) times daily.   . NON FORMULARY phophatidyl choline 385 mg x 1  . NON FORMULARY Calcium/magnesium 552m/250 x 2  . NON FORMULARY biocell Collagen 1000 mg qd  Per pt all supplements per Dr. NCarney Living . Nutritional Supplements (PYCNOGENOL) 30 MG CAPS Take 1 capsule by mouth daily.  . Omega-3 Fatty Acids (OMEGA 3 PO) Take 800 mg by mouth. Liquid form. At bedtime  . Probiotic Product (PROBIOTIC DAILY PO) Take 1 tablet by mouth daily.  .Marland KitchenVITAMIN D, CHOLECALCIFEROL, PO Take 5,000 Units by mouth.   Allergies  Allergen Reactions  . Other Swelling    MSG>swelling  Other allergies casein joint digestive, sesame ears red and jt pain, hemp/tapioca gluten digestive, amaruth, sorghum, buckwheat>gluten digestive.  Per pt this was done via blood tests with Dr. NCarney Living  . Sesame Oil   . Wheat Bran     Joint pain, digestive   Recent Results (from the past 2160 hour(s))  Comprehensive metabolic panel     Status: None   Collection Time: 08/17/17  3:01 PM  Result Value Ref Range   Sodium 138 135 - 145 mEq/L   Potassium 4.2 3.5 - 5.1 mEq/L   Chloride 101 96 - 112 mEq/L   CO2 30 19 - 32 mEq/L   Glucose, Bld 97 70 - 99 mg/dL   BUN 15 6 - 23 mg/dL   Creatinine, Ser 0.90 0.40 - 1.20 mg/dL   Total Bilirubin 0.4 0.2 - 1.2 mg/dL   Alkaline Phosphatase 47 39 - 117 U/L   AST 18 0 - 37 U/L   ALT 18 0 - 35 U/L   Total Protein 7.3 6.0 - 8.3 g/dL   Albumin 4.5 3.5 - 5.2 g/dL   Calcium 10.1 8.4 - 10.5 mg/dL   GFR 70.61 >60.00 mL/min  Urinalysis, Routine w reflex microscopic     Status: None   Collection Time: 08/17/17  3:01 PM  Result Value Ref Range   Color, Urine YELLOW Yellow;Lt. Yellow   APPearance CLEAR Clear   Specific Gravity, Urine 1.010 1.000 - 1.030   pH 7.0 5.0 - 8.0   Total Protein, Urine NEGATIVE Negative   Urine Glucose NEGATIVE Negative   Ketones, ur NEGATIVE Negative   Bilirubin Urine NEGATIVE Negative   Hgb urine  dipstick NEGATIVE Negative   Urobilinogen, UA 0.2 0.0 - 1.0   Leukocytes, UA NEGATIVE Negative   Nitrite NEGATIVE Negative  WBC, UA none seen 0-2/hpf   RBC / HPF none seen 0-2/hpf  TSH     Status: None   Collection Time: 08/17/17  3:01 PM  Result Value Ref Range   TSH 1.25 0.35 - 4.50 uIU/mL  T4, free     Status: None   Collection Time: 08/17/17  3:01 PM  Result Value Ref Range   Free T4 0.90 0.60 - 1.60 ng/dL    Comment: Specimens from patients who are undergoing biotin therapy and /or ingesting biotin supplements may contain high levels of biotin.  The higher biotin concentration in these specimens interferes with this Free T4 assay.  Specimens that contain high levels  of biotin may cause false high results for this Free T4 assay.  Please interpret results in light of the total clinical presentation of the patient.    B12     Status: Abnormal   Collection Time: 08/17/17  3:01 PM  Result Value Ref Range   Vitamin B-12 1,396 (H) 211 - 911 pg/mL  Hepatitis B Core Antibody, total     Status: None   Collection Time: 08/17/17  3:18 PM  Result Value Ref Range   Hep B Core Total Ab NON-REACTIVE NON-REACTI  Hepatitis B Surface AntiBODY     Status: None   Collection Time: 08/17/17  3:18 PM  Result Value Ref Range   Hep B S Ab NON-REACTIVE NON-REACTI  Hepatitis B Surface AntiGEN     Status: None   Collection Time: 08/17/17  3:18 PM  Result Value Ref Range   Hepatitis B Surface Ag NON-REACTIVE NON-REACTI  Hepatitis C Antibody     Status: None   Collection Time: 08/17/17  3:18 PM  Result Value Ref Range   Hepatitis C Ab NON-REACTIVE NON-REACTI   SIGNAL TO CUT-OFF 0.01 <1.00  CBC     Status: None   Collection Time: 08/17/17  4:15 PM  Result Value Ref Range   WBC 5.7 3.8 - 10.8 Thousand/uL   RBC 4.54 3.80 - 5.10 Million/uL   Hemoglobin 14.2 11.7 - 15.5 g/dL   HCT 41.3 35.0 - 45.0 %   MCV 91.0 80.0 - 100.0 fL   MCH 31.3 27.0 - 33.0 pg   MCHC 34.4 32.0 - 36.0 g/dL   RDW 11.8 11.0 -  15.0 %   Platelets 216 140 - 400 Thousand/uL   MPV 12.3 7.5 - 12.5 fL   Objective  Body mass index is 20.84 kg/m. Wt Readings from Last 3 Encounters:  08/17/17 141 lb 2 oz (64 kg)  04/01/17 134 lb (60.8 kg)  08/05/16 138 lb 2 oz (62.7 kg)   Temp Readings from Last 3 Encounters:  08/17/17 98.9 F (37.2 C) (Oral)  04/01/17 99.1 F (37.3 C) (Oral)  08/05/16 98.7 F (37.1 C) (Oral)   BP Readings from Last 3 Encounters:  08/17/17 98/60  04/01/17 108/70  08/05/16 116/60   Pulse Readings from Last 3 Encounters:  08/17/17 76  04/01/17 88  08/05/16 83   Physical Exam  Constitutional: She is oriented to person, place, and time and well-developed, well-nourished, and in no distress. Vital signs are normal.  HENT:  Head: Normocephalic and atraumatic.  Mouth/Throat: Oropharynx is clear and moist and mucous membranes are normal.  Eyes: Conjunctivae are normal. Pupils are equal, round, and reactive to light.  Cardiovascular: Normal rate, regular rhythm and normal heart sounds.  Pulmonary/Chest: Effort normal and breath sounds normal.  Abdominal: Soft. Bowel sounds are normal.  Neurological: She is  alert and oriented to person, place, and time. She has normal motor skills. Gait normal.  CN 2-12 grossly intact  Normal strength upper and lower ext b/l   Skin: Skin is warm, dry and intact.  Healing bx site   Psychiatric: Mood, memory, affect and judgment normal.  Nursing note and vitals reviewed.  Assessment   1. Left axillary pain, intermittent ? Etiology  2. Previous CXR h/o granulomatous disease  Ddx immunodeficiency, sarcoid, infectious hypersensitivity, malignancy r/o, vasculitic I.e Churg Strauss/Wegners all need to be r/o 3. Intermittent numbness in arms/legs  4. HM   Plan  1.  Will order CXR and [place] left axilla to w/u  No lymph nodes palpated on b/l axillary physical exam today  2  Repeat CXR to w/u  Wanted to order quantiferon gold lab does not have tubes to screen  for TB hopefully at f/u   Also to r/o sarcoid could consider adding on ESR/CRP, ACE level limited utility. Could also do high resolution CT chest future   Could also consider referral to pulm for consult   3.  Check CMET, CBC, UA, TSH, FT4,, Hep B/C, B12  Consider lyme serum in future if pt has been bitten by tick in past  Pt reports has been checked HIV in past and neg.   Also if w/u neg consider brain MRI w/ and w/o contrast to r/o MS and referral to neurology  4.  5.  Check lipid in future not fasting   Had flu shot 07/22/17  Tdap 02/27/14  Will disc shingrix when turns 52  Also today disc colonoscopy when 52 02/2018  mammo sch per pt 09/2017 and pap 09/2017 with Dr. Corinna Capra in [place]. Last pap 05/06/15 negative   PHQ 9 5 today    Of note see HPI advice about supplements   Saw dermatology Dr. Rochele Pages 07/2017 for nevus bx and exc sch 09/2017 2/2 dysplasia. Sees derm q6 months    Occas. etoh, No drugs/cigs.   Provider: Dr. Olivia Mackie McLean-Scocuzza

## 2017-08-20 ENCOUNTER — Ambulatory Visit
Admission: RE | Admit: 2017-08-20 | Discharge: 2017-08-20 | Disposition: A | Discharge status: Home / Self Care | Payer: BC Managed Care – PPO | Source: Ambulatory Visit | Attending: Internal Medicine | Admitting: Internal Medicine

## 2017-08-20 DIAGNOSIS — M79622 Pain in left upper arm: Secondary | ICD-10-CM | POA: Diagnosis not present

## 2017-08-23 ENCOUNTER — Encounter: Payer: Self-pay | Admitting: Internal Medicine

## 2017-09-10 ENCOUNTER — Ambulatory Visit: Payer: BC Managed Care – PPO | Admitting: Internal Medicine

## 2017-09-10 ENCOUNTER — Encounter: Payer: Self-pay | Admitting: Internal Medicine

## 2017-09-10 VITALS — BP 106/76 | HR 89 | Temp 98.9°F | Ht 69.0 in | Wt 140.0 lb

## 2017-09-10 DIAGNOSIS — R202 Paresthesia of skin: Secondary | ICD-10-CM | POA: Diagnosis not present

## 2017-09-10 DIAGNOSIS — M79622 Pain in left upper arm: Secondary | ICD-10-CM | POA: Diagnosis not present

## 2017-09-10 DIAGNOSIS — Z13220 Encounter for screening for lipoid disorders: Secondary | ICD-10-CM | POA: Diagnosis not present

## 2017-09-10 DIAGNOSIS — E559 Vitamin D deficiency, unspecified: Secondary | ICD-10-CM | POA: Diagnosis not present

## 2017-09-10 DIAGNOSIS — N926 Irregular menstruation, unspecified: Secondary | ICD-10-CM

## 2017-09-10 DIAGNOSIS — J8410 Pulmonary fibrosis, unspecified: Secondary | ICD-10-CM

## 2017-09-10 DIAGNOSIS — R200 Anesthesia of skin: Secondary | ICD-10-CM | POA: Diagnosis not present

## 2017-09-10 NOTE — Patient Instructions (Signed)
F/u in 3-4 months  Schedule labs w/in 1 week here   Paresthesia Paresthesia is an abnormal burning or prickling sensation. This sensation is generally felt in the hands, arms, legs, or feet. However, it may occur in any part of the body. Usually, it is not painful. The feeling may be described as:  Tingling or numbness.  Pins and needles.  Skin crawling.  Buzzing.  Limbs falling asleep.  Itching.  Most people experience temporary (transient) paresthesia at some time in their lives. Paresthesia may occur when you breathe too quickly (hyperventilation). It can also occur without any apparent cause. Commonly, paresthesia occurs when pressure is placed on a nerve. The sensation quickly goes away after the pressure is removed. For some people, however, paresthesia is a long-lasting (chronic) condition that is caused by an underlying disorder. If you continue to have paresthesia, you may need further medical evaluation. Follow these instructions at home: Watch your condition for any changes. Taking the following actions may help to lessen any discomfort that you are feeling:  Avoid drinking alcohol.  Try acupuncture or massage to help relieve your symptoms.  Keep all follow-up visits as directed by your health care provider. This is important.  Contact a health care provider if:  You continue to have episodes of paresthesia.  Your burning or prickling feeling gets worse when you walk.  You have pain, cramps, or dizziness.  You develop a rash. Get help right away if:  You feel weak.  You have trouble walking or moving.  You have problems with speech, understanding, or vision.  You feel confused.  You cannot control your bladder or bowel movements.  You have numbness after an injury.  You faint. This information is not intended to replace advice given to you by your health care provider. Make sure you discuss any questions you have with your health care provider. Document  Released: 08/28/2002 Document Revised: 02/13/2016 Document Reviewed: 09/03/2014 Elsevier Interactive Patient Education  [employer].

## 2017-09-13 ENCOUNTER — Encounter: Payer: Self-pay | Admitting: Internal Medicine

## 2017-09-13 DIAGNOSIS — R202 Paresthesia of skin: Secondary | ICD-10-CM

## 2017-09-13 DIAGNOSIS — R200 Anesthesia of skin: Secondary | ICD-10-CM | POA: Insufficient documentation

## 2017-09-13 DIAGNOSIS — N926 Irregular menstruation, unspecified: Secondary | ICD-10-CM | POA: Insufficient documentation

## 2017-09-13 HISTORY — DX: Anesthesia of skin: R20.0

## 2017-09-13 NOTE — Progress Notes (Addendum)
Chief Complaint  Patient presents with  . Follow-up    disc results    F/u  1. Disc results CXR still consistent with granulomatous disease. Explained etiology could be hypersensitivity vs TB vs Ch[place] Straus, sarcoid, infection, immune no clear etiology based on labs will disc with pulmonary. Pt wants to know if could be [place] eye MD told her she has scar tissue in retina on exam 1 week ago  2. C/o no cycle and cycle is pretty consistent. She has not had cycle since Saturday when supposed to start LMP 08/07/17 3. Left axilla pain improved reviewed results [place] L axilla pt has mammo sch 09/2017  4. Intermittent numbness/tinging in random extremities improved. This has been ongoing x years.    Review of Systems  HENT: Negative for hearing loss.   Eyes:       +scar tissue in retina per pt   Respiratory: Negative for shortness of breath.   Cardiovascular: Negative for chest pain.  Gastrointestinal: Negative for abdominal pain.  Genitourinary:       +abnormal menses per pt   Skin: Negative for rash.  Neurological: Negative for sensory change.  Psychiatric/Behavioral: Negative for memory loss.   Past Medical History:  Diagnosis Date  . Dysplastic nevi    s/p removal 07/30/17 and exc sch 10/04/17 trunk  . Gluten intolerance    Wheat, caicin  . History of chicken pox    Past Surgical History:  Procedure Laterality Date  . TONSILLECTOMY AND ADENOIDECTOMY  1973   Family History  Problem Relation Age of Onset  . Lung cancer Mother   . Stroke Mother   . Cancer Mother        lung cancer smoker   . Lung cancer Father   . Cancer Father        lung cancer - smoker  . Cancer Paternal Aunt        ovarian cancer  . Early death Sister        automobile accident  . Heart disease Paternal Grandfather   . Thyroid disease Maternal Grandmother    Social History   Socioeconomic History  . Marital status: Married    Spouse name: Not on file  . Number of  children: 1  . Years of education: 48  . Highest education level: Not on file  Social Needs  . Financial resource strain: Not on file  . Food insecurity - worry: Not on file  . Food insecurity - inability: Not on file  . Transportation needs - medical: Not on file  . Transportation needs - non-medical: Not on file  Occupational History  . Occupation: Market researcher: [employer]  Tobacco Use  . Smoking status: Never Smoker  . Smokeless tobacco: Never Used  Substance and Sexual Activity  . Alcohol use: Yes    Comment: 2-3 times a month  . Drug use: No  . Sexual activity: Yes  Other Topics Concern  . Not on file  Social History Narrative   Kara Spencer grew up in [place]. She attended [employer] and obtained her Paediatric nurse in Communication Disorders. She then obtained her Masters in Sport and exercise psychologist from [employer]. She currently lives in [place] with her husband and daughter (as of 07/2017 daughter is 7.5 y.o)   Current Meds  Medication Sig  . Ascorbic Acid (VITAMIN C) 1000 MG tablet Take 1,000 mg by mouth 3 (three) times  daily.  . ASTAXANTHIN PO Take 12 mg by mouth daily.  . Calcium-Magnesium-Vitamin D (CALCIUM MAGNESIUM PO) Take by mouth daily.  Marland Kitchen DIGESTIVE ENZYMES PO Take 1 tablet by mouth 3 (three) times daily.  . Misc Natural Products (CHLORELLA) 500 MG CAPS Take 500 mg by mouth daily.  . Multiple Vitamin (MULTIVITAMIN) capsule Take 1 capsule by mouth 2 (two) times daily.   . NON FORMULARY phophatidyl choline 385 mg x 1  . NON FORMULARY Calcium/magnesium 500mg /250 x 2  . NON FORMULARY biocell Collagen 1000 mg qd  Per pt all supplements per Dr. Carney Living  . Nutritional Supplements (PYCNOGENOL) 30 MG CAPS Take 1 capsule by mouth daily.  . Omega-3 Fatty Acids (OMEGA 3 PO) Take 800 mg by mouth. Liquid form. At bedtime  . Probiotic Product (PROBIOTIC DAILY PO) Take 1 tablet by mouth daily.  Marland Kitchen VITAMIN D, CHOLECALCIFEROL,  PO Take 5,000 Units by mouth.   Allergies  Allergen Reactions  . Other Swelling    MSG>swelling  Other allergies casein joint digestive, sesame ears red and jt pain, hemp/tapioca gluten digestive, amaruth, sorghum, buckwheat>gluten digestive.  Per pt this was done via blood tests with Dr. Carney Living   . Sesame Oil   . Wheat Bran     Joint pain, digestive   Recent Results (from the past 2160 hour(s))  Comprehensive metabolic panel     Status: None   Collection Time: 08/17/17  3:01 PM  Result Value Ref Range   Sodium 138 135 - 145 mEq/L   Potassium 4.2 3.5 - 5.1 mEq/L   Chloride 101 96 - 112 mEq/L   CO2 30 19 - 32 mEq/L   Glucose, Bld 97 70 - 99 mg/dL   BUN 15 6 - 23 mg/dL   Creatinine, Ser 0.90 0.40 - 1.20 mg/dL   Total Bilirubin 0.4 0.2 - 1.2 mg/dL   Alkaline Phosphatase 47 39 - 117 U/L   AST 18 0 - 37 U/L   ALT 18 0 - 35 U/L   Total Protein 7.3 6.0 - 8.3 g/dL   Albumin 4.5 3.5 - 5.2 g/dL   Calcium 10.1 8.4 - 10.5 mg/dL   GFR 70.61 >60.00 mL/min  Urinalysis, Routine w reflex microscopic     Status: None   Collection Time: 08/17/17  3:01 PM  Result Value Ref Range   Color, Urine YELLOW Yellow;Lt. Yellow   APPearance CLEAR Clear   Specific Gravity, Urine 1.010 1.000 - 1.030   pH 7.0 5.0 - 8.0   Total Protein, Urine NEGATIVE Negative   Urine Glucose NEGATIVE Negative   Ketones, ur NEGATIVE Negative   Bilirubin Urine NEGATIVE Negative   Hgb urine dipstick NEGATIVE Negative   Urobilinogen, UA 0.2 0.0 - 1.0   Leukocytes, UA NEGATIVE Negative   Nitrite NEGATIVE Negative   WBC, UA none seen 0-2/hpf   RBC / HPF none seen 0-2/hpf  TSH     Status: None   Collection Time: 08/17/17  3:01 PM  Result Value Ref Range   TSH 1.25 0.35 - 4.50 uIU/mL  T4, free     Status: None   Collection Time: 08/17/17  3:01 PM  Result Value Ref Range   Free T4 0.90 0.60 - 1.60 ng/dL    Comment: Specimens from patients who are undergoing biotin therapy and /or ingesting biotin supplements may  contain high levels of biotin.  The higher biotin concentration in these specimens interferes with this Free T4 assay.  Specimens that contain high levels  of  biotin may cause false high results for this Free T4 assay.  Please interpret results in light of the total clinical presentation of the patient.    B12     Status: Abnormal   Collection Time: 08/17/17  3:01 PM  Result Value Ref Range   Vitamin B-12 1,396 (H) 211 - 911 pg/mL  Antinuclear Antib (ANA)     Status: None   Collection Time: 08/17/17  3:18 PM  Result Value Ref Range   Anit Nuclear Antibody(ANA) NEGATIVE NEGATIVE    Comment: ANA IFA is a first line screen for detecting the presence of up to approximately 150 autoantibodies in various autoimmune diseases. A negative ANA IFA result suggests ANA-associated autoimmune diseases are not present at this time. . Visit Physician FAQs for interpretation of all antibodies in the [place], prevalence, and association with diseases at http://education.[facility].com/ [email or web address]/[email or web address] .   Hepatitis B Core Antibody, total     Status: None   Collection Time: 08/17/17  3:18 PM  Result Value Ref Range   Hep B Core Total Ab NON-REACTIVE NON-REACTI  Hepatitis B Surface AntiBODY     Status: None   Collection Time: 08/17/17  3:18 PM  Result Value Ref Range   Hep B S Ab NON-REACTIVE NON-REACTI  Hepatitis B Surface AntiGEN     Status: None   Collection Time: 08/17/17  3:18 PM  Result Value Ref Range   Hepatitis B Surface Ag NON-REACTIVE NON-REACTI  Hepatitis C Antibody     Status: None   Collection Time: 08/17/17  3:18 PM  Result Value Ref Range   Hepatitis C Ab NON-REACTIVE NON-REACTI   SIGNAL TO CUT-OFF 0.01 <1.00  CBC     Status: None   Collection Time: 08/17/17  4:15 PM  Result Value Ref Range   WBC 5.7 3.8 - 10.8 Thousand/uL   RBC 4.54 3.80 - 5.10 Million/uL   Hemoglobin 14.2 11.7 - 15.5 g/dL   HCT 41.3 35.0 - 45.0 %   MCV 91.0 80.0 - 100.0 fL   MCH 31.3 27.0 - 33.0 pg    MCHC 34.4 32.0 - 36.0 g/dL   RDW 11.8 11.0 - 15.0 %   Platelets 216 140 - 400 Thousand/uL   MPV 12.3 7.5 - 12.5 fL   Objective  Body mass index is 20.67 kg/m. Wt Readings from Last 3 Encounters:  09/10/17 140 lb (63.5 kg)  08/17/17 141 lb 2 oz (64 kg)  04/01/17 134 lb (60.8 kg)   Temp Readings from Last 3 Encounters:  09/10/17 98.9 F (37.2 C) (Oral)  08/17/17 98.9 F (37.2 C) (Oral)  04/01/17 99.1 F (37.3 C) (Oral)   BP Readings from Last 3 Encounters:  09/10/17 106/76  08/17/17 98/60  04/01/17 108/70   Pulse Readings from Last 3 Encounters:  09/10/17 89  08/17/17 76  04/01/17 88   O2 sat 98% room air   Physical Exam  Constitutional: She is oriented to person, place, and time and well-developed, well-nourished, and in no distress.  HENT:  Head: Normocephalic and atraumatic.  Eyes: Conjunctivae are normal. Pupils are equal, round, and reactive to light.  Cardiovascular: Normal rate, regular rhythm and normal heart sounds.  Pulmonary/Chest: Effort normal and breath sounds normal.  Neurological: She is alert and oriented to person, place, and time. Gait normal.  Skin: Skin is warm and dry.  Psychiatric: Mood, memory, affect and judgment normal.  Nursing note and vitals reviewed.   Assessment   1. 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 Abnormal menses could be perimenopausal sx's  3. Left axillary pain [place] with normal appearing lymph nodes  4. Intermittent parathesia in all limbs  5. HM Plan  1. W/u with TB Gold. ANA neg, Hep B/C negative  Disc with pulm to see other etiology she did grown up in [place] so consider histio, coccidio/blasto in Ddx  Considered chest CT/high resolution CT to further w/u  -spoke with pulm who rec CT chest with IV contrast and also TB gold  2.  Check FSH  3.mammo sch 09/2017 with OB/GYN 4. Disc consider w/u with MRI brain if continues B12 elevated  not low.  5.   Reviewed labs   rec hep B vaccine   See HM 08/17/2017  Flu and Tdap UTD  Pap upcoming and mammo 09/2017 Dr. Corinna Capra   Provider: Dr. Olivia Mackie McLean-Scocuzza-[facility]

## 2017-09-17 ENCOUNTER — Telehealth: Payer: Self-pay

## 2017-09-17 ENCOUNTER — Other Ambulatory Visit: Payer: Self-pay | Admitting: Internal Medicine

## 2017-09-17 ENCOUNTER — Other Ambulatory Visit (INDEPENDENT_AMBULATORY_CARE_PROVIDER_SITE_OTHER): Payer: BC Managed Care – PPO

## 2017-09-17 DIAGNOSIS — J8410 Pulmonary fibrosis, unspecified: Secondary | ICD-10-CM

## 2017-09-17 DIAGNOSIS — N926 Irregular menstruation, unspecified: Secondary | ICD-10-CM

## 2017-09-17 DIAGNOSIS — E559 Vitamin D deficiency, unspecified: Secondary | ICD-10-CM

## 2017-09-17 DIAGNOSIS — R0602 Shortness of breath: Secondary | ICD-10-CM

## 2017-09-17 DIAGNOSIS — Z13220 Encounter for screening for lipoid disorders: Secondary | ICD-10-CM

## 2017-09-17 LAB — LIPID PANEL
CHOLESTEROL: 154 mg/dL (ref 0–200)
HDL: 55 mg/dL (ref >39.00)
LDL CALC: 90 mg/dL (ref 0–99)
NonHDL: 99.36
TRIGLYCERIDES: 49 mg/dL (ref 0.0–149.0)
Total CHOL/HDL Ratio: 3
VLDL: 9.8 mg/dL (ref 0.0–40.0)

## 2017-09-17 LAB — FOLLICLE STIMULATING HORMONE: FSH: 30.9 m[IU]/mL

## 2017-09-17 LAB — VITAMIN D 25 HYDROXY (VIT D DEFICIENCY, FRACTURES): VITD: 52.1 ng/mL (ref 30.00–100.00)

## 2017-09-17 NOTE — Telephone Encounter (Signed)
-----   Message from Delorise Jackson, MD sent at 09/16/2017  7:55 PM EST ----- I spoke with lung doctor who rec CT chest with IV contrast and also TB gold which we have ordered to screen for TB to work up granulomas in lung   How does she feel about CT chest with contrast dye?  Thanks [employer]

## 2017-09-17 NOTE — Telephone Encounter (Signed)
Ordered  [place]

## 2017-09-17 NOTE — Telephone Encounter (Signed)
Spoke with patient she is ok with going forward with CT chest with contrast

## 2017-09-21 LAB — TIQ-NTM

## 2017-09-21 LAB — QUANTIFERON-TB GOLD PLUS
NIL: 0.12 [IU]/mL
QUANTIFERON-TB GOLD PLUS: NEGATIVE
TB1-NIL: 0.04 [IU]/mL
TB2-NIL: 0.02 [IU]/mL

## 2017-10-01 ENCOUNTER — Ambulatory Visit: Payer: BC Managed Care – PPO

## 2017-10-05 ENCOUNTER — Other Ambulatory Visit: Payer: Self-pay | Admitting: Internal Medicine

## 2017-10-05 ENCOUNTER — Telehealth: Payer: Self-pay

## 2017-10-05 DIAGNOSIS — R202 Paresthesia of skin: Principal | ICD-10-CM

## 2017-10-05 DIAGNOSIS — R200 Anesthesia of skin: Secondary | ICD-10-CM

## 2017-10-05 NOTE — Telephone Encounter (Signed)
I wanted to do MRI brain not sure if we can do it in this amt of time but will order  Kara Spencer please call pt

## 2017-10-05 NOTE — Telephone Encounter (Signed)
Copied from [place] [id number]. Topic: Referral - Request >> Oct 05, 2017  3:49 PM Yvette Rack wrote: Reason for CRM: patient states that Orland Mustard, MD was going to schedule her a CT of her head and pt declined pt has changed her mind stating that she has and appointment for a CT of chest on Friday and would like to just get that done of her head also do it all at the same time the pt will be going to [facility] for her CT

## 2017-10-06 ENCOUNTER — Telehealth: Payer: Self-pay

## 2017-10-06 NOTE — Telephone Encounter (Signed)
Left message for patient to return call back. [facility] may give information.  

## 2017-10-06 NOTE — Telephone Encounter (Signed)
Pt has been scheduled for Friday. Pt is aware it is not on same day as CT.

## 2017-10-06 NOTE — Telephone Encounter (Signed)
Please advise 

## 2017-10-08 ENCOUNTER — Ambulatory Visit
Admission: RE | Admit: 2017-10-08 | Discharge: 2017-10-08 | Disposition: A | Discharge status: Home / Self Care | Payer: BC Managed Care – PPO | Source: Ambulatory Visit | Attending: Internal Medicine | Admitting: Internal Medicine

## 2017-10-08 DIAGNOSIS — R0602 Shortness of breath: Secondary | ICD-10-CM | POA: Insufficient documentation

## 2017-10-08 DIAGNOSIS — J8410 Pulmonary fibrosis, unspecified: Secondary | ICD-10-CM | POA: Diagnosis not present

## 2017-10-08 MED ORDER — IOPAMIDOL (ISOVUE-300) INJECTION 61%
75.0000 mL | Freq: Once | INTRAVENOUS | Status: AC | PRN
  Administered 2017-10-08: 75 mL via INTRAVENOUS

## 2017-10-15 ENCOUNTER — Ambulatory Visit
Admission: RE | Admit: 2017-10-15 | Discharge: 2017-10-15 | Disposition: A | Discharge status: Home / Self Care | Payer: BC Managed Care – PPO | Source: Ambulatory Visit | Attending: Internal Medicine | Admitting: Internal Medicine

## 2017-10-15 DIAGNOSIS — R200 Anesthesia of skin: Secondary | ICD-10-CM | POA: Insufficient documentation

## 2017-10-15 DIAGNOSIS — R202 Paresthesia of skin: Secondary | ICD-10-CM | POA: Diagnosis present

## 2017-10-15 MED ORDER — GADOBENATE DIMEGLUMINE 529 MG/ML IV SOLN
15.0000 mL | Freq: Once | INTRAVENOUS | Status: AC | PRN
Start: 2017-10-15 — End: 2017-10-15
  Administered 2017-10-15: 14 mL via INTRAVENOUS

## 2017-12-13 ENCOUNTER — Encounter: Payer: Self-pay | Admitting: Internal Medicine

## 2017-12-13 ENCOUNTER — Ambulatory Visit: Payer: BC Managed Care – PPO | Admitting: Internal Medicine

## 2017-12-13 DIAGNOSIS — L84 Corns and callosities: Secondary | ICD-10-CM

## 2017-12-13 DIAGNOSIS — J302 Other seasonal allergic rhinitis: Secondary | ICD-10-CM | POA: Diagnosis not present

## 2017-12-13 DIAGNOSIS — D71 Functional disorders of polymorphonuclear neutrophils: Secondary | ICD-10-CM | POA: Diagnosis not present

## 2017-12-13 NOTE — Patient Instructions (Addendum)
Try Allegra, Claritin or zyrtec  Allegra would be 2x per day if 12 hour formulation  Try the eye drops  If not better let me know I want to send you to an allergist Please let me know about GI doctor your husband saw  Consider Shingrix vaccine   Corns and Calluses Corns are small areas of thickened skin that occur on the top, sides, or tip of a toe. They contain a cone-shaped core with a point that can press on a nerve below. This causes pain. Calluses are areas of thickened skin that can occur anywhere on the body including hands, fingers, palms, soles of the feet, and heels.Calluses are usually larger than corns. What are the causes? Corns and calluses are caused by rubbing (friction) or pressure, such as from shoes that are too tight or do not fit properly. What increases the risk? Corns are more likely to develop in people who have toe deformities, such as hammer toes. Since calluses can occur with friction to any area of the skin, calluses are more likely to develop in people who:  Work with their hands.  Wear shoes that fit poorly, shoes that are too tight, or shoes that are high-heeled.  Have toes deformities.  What are the signs or symptoms? Symptoms of a corn or callus include:  A hard growth on the skin.  Pain or tenderness under the skin.  Redness and swelling.  Increased discomfort while wearing tight-fitting shoes.  How is this diagnosed? Corns and calluses may be diagnosed with a medical history and physical exam. How is this treated? Corns and calluses may be treated with:  Removing the cause of the friction or pressure. This may include: ? Changing your shoes. ? Wearing shoe inserts (orthotics) or other protective layers in your shoes, such as a corn pad. ? Wearing gloves.  Medicines to help soften skin in the hardened, thickened areas.  Reducing the size of the corn or callus by removing the dead layers of skin.  Antibiotic medicines to treat  infection.  Surgery, if a toe deformity is the cause.  Follow these instructions at home:  Take medicines only as directed by your health care provider.  If you were prescribed an antibiotic, finish all of it even if you start to feel better.  Wear shoes that fit well. Avoid wearing high-heeled shoes and shoes that are too tight or too loose.  Wear any padding, protective layers, gloves, or orthotics as directed by your health care provider.  Soak your hands or feet and then use a file or pumice stone to soften your corn or callus. Do this as directed by your health care provider.  Check your corn or callus every day for signs of infection. Watch for: ? Redness, swelling, or pain. ? Fluid, blood, or pus. Contact a health care provider if:  Your symptoms do not improve with treatment.  You have increased redness, swelling, or pain at the site of your corn or callus.  You have fluid, blood, or pus coming from your corn or callus.  You have new symptoms. This information is not intended to replace advice given to you by your health care provider. Make sure you discuss any questions you have with your health care provider. Document Released: 06/13/2004 Document Revised: 03/27/2016 Document Reviewed: 09/03/2014 Elsevier Interactive Patient Education  2018 [place], Adult An allergy is when your body's defense system (immune system) overreacts to an otherwise harmless substance (allergen) that you  breathe in or eat or something that touches your skin. When you come into contact with something that you are allergic to, your immune system produces certain proteins (antibodies). These proteins cause cells to release chemicals (histamines) that trigger the symptoms of an allergic reaction. Allergies often affect the nasal passages (allergic rhinitis), eyes (allergic conjunctivitis), skin (atopic dermatitis), and stomach. Allergies can be mild or severe. Allergies cannot  spread from person to person (are not contagious). They can develop at any age and may be outgrown. What increases the risk? You may be at greater risk of allergies if other people in your family have allergies. What are the signs or symptoms? Symptoms depend on what type of allergy you have. They may include:  Runny, stuffy nose.  Sneezing.  Itchy mouth, ears, or throat.  Postnasal drip.  Sore throat.  Itchy, red, watery, or puffy eyes.  Skin rash or hives.  Stomach pain.  Vomiting.  Diarrhea.  Bloating.  Wheezing or coughing.  People with a severe allergy to food, medicine, or an insect bite may have a life-threatening allergic reaction (anaphylaxis). Symptoms of anaphylaxis include:  Hives.  Itching.  Flushed face.  Swollen lips, tongue, or mouth.  Tight or swollen throat.  Chest pain or tightness in the chest.  Trouble breathing or shortness of breath.  Rapid heartbeat.  Dizziness or fainting.  Vomiting.  Diarrhea.  Pain in the abdomen.  How is this diagnosed? This condition is diagnosed based on:  Your symptoms.  Your family and medical history.  A physical exam.  You may need to see a health care provider who specializes in treating allergies (allergist). You may also have tests, including:  Skin tests to see which allergens are causing your symptoms, such as: ? Skin prick test. In this test, your skin is pricked with a tiny needle and exposed to small amounts of possible allergens to see if your skin reacts. ? Intradermal skin test. In this test, a small amount of allergen is injected under your skin to see if your skin reacts. ? Patch test. In this test, a small amount of allergen is placed on your skin and then your skin is covered with a bandage. Your health care provider will check your skin after a couple of days to see if a rash has developed.  Blood tests.  Challenges tests. In this test, you inhale a small amount of allergen by  mouth to see if you have an allergic reaction.  You may also be asked to:  Keep a food diary. A food diary is a record of all the foods and drinks you have in a day and any symptoms you experience.  Practice an elimination diet. An elimination diet involves eliminating specific foods from your diet and then adding them back in one by one to find out if a certain food causes an allergic reaction.  How is this treated? Treatment for allergies depends on your symptoms. Treatment may include:  Cold compresses to soothe itching and swelling.  Eye drops.  Nasal sprays.  Using a saline spray or container (neti pot) to flush out the nose (nasal irrigation). These methods can help clear away mucus and keep the nasal passages moist.  Using a humidifier.  Oral antihistamines or other medicines to block allergic reaction and inflammation.  Skin creams to treat rashes or itching.  Diet changes to eliminate food allergy triggers.  Repeated exposure to tiny amounts of allergens to build up a tolerance and prevent future  allergic reactions (immunotherapy). These include: ? Allergy shots. ? Oral treatment. This involves taking small doses of an allergen under the tongue (sublingual immunotherapy).  Emergency epinephrine injection (auto-injector) in case of an allergic emergency. This is a self-injectable, pre-measured medicine that must be given within the first few minutes of a serious allergic reaction.  Follow these instructions at home:  Avoid known allergens whenever possible.  If you suffer from airborne allergens, wash out your nose daily. You can do this with a saline spray or a neti pot to flush out your nose (nasal irrigation).  Take over-the-counter and prescription medicines only as told by your health care provider.  Keep all follow-up visits as told by your health care provider. This is important.  If you are at risk of a severe allergic reaction (anaphylaxis), keep your  auto-injector with you at all times.  If you have ever had anaphylaxis, wear a medical alert bracelet or necklace that states you have a severe allergy. Contact a health care provider if:  Your symptoms do not improve with treatment. Get help right away if:  You have symptoms of anaphylaxis, such as: ? Swollen mouth, tongue, or throat. ? Pain or tightness in your chest. ? Trouble breathing or shortness of breath. ? Dizziness or fainting. ? Severe abdominal pain, vomiting, or diarrhea. This information is not intended to replace advice given to you by your health care provider. Make sure you discuss any questions you have with your health care provider. Document Released: 12/01/2002 Document Revised: 01/06/2017 Document Reviewed: 03/25/2016 Elsevier Interactive Patient Education  [employer].

## 2017-12-13 NOTE — Progress Notes (Signed)
Pre visit review using our clinic review tool, if applicable. No additional management support is needed unless otherwise documented below in the visit note. 

## 2017-12-15 ENCOUNTER — Encounter: Payer: Self-pay | Admitting: Internal Medicine

## 2017-12-15 NOTE — Progress Notes (Signed)
Chief Complaint  Patient presents with  . Follow-up   F/u  1. Reviewed CT chest imaging +granulomas in chest and liver and spleen reviewed with pt again. TB gold blood test negative no likely related to TB 2. Numbness and tingling is better in upper and lower ext she thinks its positional from lying or crossing legs  3. C/o allergies with runny nose, scratch throat, eyes watery. She hasnt tried any medications  4. Irregular menses, menses now normal since last visit  5. Due for colonoscopy but wants to do this in the fall 2019  6. C/o right foot lateral foot deformity and want to know if normal and also rough spot in that area that she recently noticed    Review of Systems  Constitutional: Negative for weight loss.  HENT:       +runny nose, scratchy throat   Eyes:       +watery eyes   Respiratory: Negative for shortness of breath.   Cardiovascular: Negative for chest pain.  Skin: Negative for rash.  Neurological: Negative for sensory change.  Endo/Heme/Allergies: Positive for environmental allergies.   Past Medical History:  Diagnosis Date  . Dysplastic nevi    s/p removal 07/30/17 and exc sch 10/04/17 trunk  . Gluten intolerance    Wheat, caicin  . History of chicken pox    Past Surgical History:  Procedure Laterality Date  . TONSILLECTOMY AND ADENOIDECTOMY  1973   Family History  Problem Relation Age of Onset  . Lung cancer Mother   . Stroke Mother   . Cancer Mother        lung cancer smoker   . Lung cancer Father   . Cancer Father        lung cancer - smoker  . Cancer Paternal Aunt        ovarian cancer  . Early death Sister        automobile accident  . Heart disease Paternal Grandfather   . Thyroid disease Maternal Grandmother    Social History   Socioeconomic History  . Marital status: Married    Spouse name: Not on file  . Number of children: 1  . Years of education: 38  . Highest education level: Not on file  Occupational History  . Occupation:  Market researcher: [employer]  Social Needs  . Financial resource strain: Not on file  . Food insecurity:    Worry: Not on file    Inability: Not on file  . Transportation needs:    Medical: Not on file    Non-medical: Not on file  Tobacco Use  . Smoking status: Never Smoker  . Smokeless tobacco: Never Used  Substance and Sexual Activity  . Alcohol use: Yes    Comment: 2-3 times a month  . Drug use: No  . Sexual activity: Yes  Lifestyle  . Physical activity:    Days per week: Not on file    Minutes per session: Not on file  . Stress: Not on file  Relationships  . Social connections:    Talks on phone: Not on file    Gets together: Not on file    Attends religious service: Not on file    Active member of club or organization: Not on file    Attends meetings of clubs or organizations: Not on file    Relationship status: Not on file  . Intimate partner violence:    Fear of current or ex partner: Not  on file    Emotionally abused: Not on file    Physically abused: Not on file    Forced sexual activity: Not on file  Other Topics Concern  . Not on file  Social History Narrative   Nasia grew up in [place]. She attended [employer] and obtained her Paediatric nurse in Communication Disorders. She then obtained her Masters in Sport and exercise psychologist from [employer]. She currently lives in [place] with her husband and daughter (as of 07/2017 daughter is 7.5 y.o)   Current Meds  Medication Sig  . Ascorbic Acid (VITAMIN C) 1000 MG tablet Take 1,000 mg by mouth 3 (three) times daily.  . ASTAXANTHIN PO Take 12 mg by mouth daily.  . Calcium-Magnesium-Vitamin D (CALCIUM MAGNESIUM PO) Take by mouth daily.  Marland Kitchen DIGESTIVE ENZYMES PO Take 1 tablet by mouth 3 (three) times daily.  . Misc Natural Products (CHLORELLA) 500 MG CAPS Take 500 mg by mouth daily.  . Multiple Vitamin (MULTIVITAMIN) capsule Take 1 capsule by mouth 2 (two) times  daily.   . NON FORMULARY phophatidyl choline 385 mg x 1  . NON FORMULARY Calcium/magnesium 500mg /250 x 2  . NON FORMULARY biocell Collagen 1000 mg qd  Per pt all supplements per Dr. Carney Living  . Nutritional Supplements (PYCNOGENOL) 30 MG CAPS Take 1 capsule by mouth daily.  . Omega-3 Fatty Acids (OMEGA 3 PO) Take 800 mg by mouth. Liquid form. At bedtime  . Probiotic Product (PROBIOTIC DAILY PO) Take 1 tablet by mouth daily.  Marland Kitchen VITAMIN D, CHOLECALCIFEROL, PO Take 5,000 Units by mouth.   Allergies  Allergen Reactions  . Other Swelling    MSG>swelling  Other allergies casein joint digestive, sesame ears red and jt pain, hemp/tapioca gluten digestive, amaruth, sorghum, buckwheat>gluten digestive.  Per pt this was done via blood tests with Dr. Carney Living   . Sesame Oil   . Wheat Bran     Joint pain, digestive   Recent Results (from the past 2160 hour(s))  VITAMIN D 25 Hydroxy (Vit-D Deficiency, Fractures)     Status: None   Collection Time: 09/17/17 10:31 AM  Result Value Ref Range   VITD 52.10 30.00 - 100.00 ng/mL  Lipid panel     Status: None   Collection Time: 09/17/17 10:31 AM  Result Value Ref Range   Cholesterol 154 0 - 200 mg/dL    Comment: ATP III Classification       Desirable:  < 200 mg/dL               Borderline High:  200 - 239 mg/dL          High:  > = 240 mg/dL   Triglycerides 49.0 0.0 - 149.0 mg/dL    Comment: Normal:  <150 mg/dLBorderline High:  150 - 199 mg/dL   HDL 55.00 >39.00 mg/dL   VLDL 9.8 0.0 - 40.0 mg/dL   LDL Cholesterol 90 0 - 99 mg/dL   Total CHOL/HDL Ratio 3     Comment:                Men          Women1/2 Average Risk     3.4          3.3Average Risk          5.0          4.42X Average Risk          9.6  7.13X Average Risk          15.0          11.0                       NonHDL 99.36     Comment: NOTE:  Non-HDL goal should be 30 mg/dL higher than patient's LDL goal (i.e. LDL goal of < 70 mg/dL, would have non-HDL goal of < 100 mg/dL)  FSH     Status:  None   Collection Time: 09/17/17 10:31 AM  Result Value Ref Range   FSH 30.9 mIU/ML    Comment: Female Reference Range:  1.4-18.1 mIU/mLFemale Reference Range:Follicular Phase          2.5-10.2 mIU/mLMidCycle Peak          3.4-33.4 mIU/mLLuteal Phase          1.5-9.1 mIU/mLPost Menopausal     23.0-116.3 mIU/mLPregnant          <0.3 mIU/mL  QuantiFERON-TB Gold Plus     Status: None   Collection Time: 09/17/17 10:31 AM  Result Value Ref Range   QuantiFERON-TB Gold Plus NEGATIVE NEGATIVE    Comment: Negative test result. M. tuberculosis complex  infection unlikely.    NIL 0.12 IU/mL   Mitogen-NIL >10.00 IU/mL   TB1-NIL 0.04 IU/mL   TB2-NIL 0.02 IU/mL    Comment: . The Nil tube value reflects the background interferon gamma immune response of the patient's blood sample. This value has been subtracted from the patient's displayed TB and Mitogen results. . Lower than expected results with the Mitogen tube prevent false-negative Quantiferon readings by detecting a patient with a potential immune suppressive condition and/or suboptimal pre-analytical specimen handling. . The TB1 Antigen tube is coated with the M. tuberculosis-specific antigens designed to elicit responses from TB antigen primed CD4+ helper T-lymphocytes. . The TB2 Antigen tube is coated with the M. tuberculosis-specific antigens designed to elicit responses from TB antigen primed CD4+ helper and CD8+ cytotoxic T-lymphocytes. . For additional information, please refer to http://education.quest[facility].com/faq/204 (This link is being provided for informational/ educational purposes only.) .   TIQ-NTM     Status: None   Collection Time: 09/17/17 10:31 AM  Result Value Ref Range   QUESTION/PROBLEM:      Comment: . The following date of service/collection is questionable. Marland Kitchen    SPECIMEN(S) RECEIVED: Y     Comment: To prevent further delays in testing, please contact us immediately at [phone]  [phone] order to  resolve this questionable order. Fax completed form to [phone].    Objective  Body mass index is 21.15 kg/m. Wt Readings from Last 3 Encounters:  12/13/17 143 lb 3.2 oz (65 kg)  09/10/17 140 lb (63.5 kg)  08/17/17 141 lb 2 oz (64 kg)   Temp Readings from Last 3 Encounters:  12/13/17 98.7 F (37.1 C) (Oral)  09/10/17 98.9 F (37.2 C) (Oral)  08/17/17 98.9 F (37.2 C) (Oral)   BP Readings from Last 3 Encounters:  12/13/17 92/60  09/10/17 106/76  08/17/17 98/60   Pulse Readings from Last 3 Encounters:  12/13/17 72  09/10/17 89  08/17/17 76   O2 sat room air 98% Physical Exam  Constitutional: She is oriented to person, place, and time and well-developed, well-nourished, and in no distress. Vital signs are normal.  HENT:  Head: Normocephalic and atraumatic.  Mouth/Throat: Oropharynx is clear and moist and mucous membranes are normal.  Eyes: Pupils are equal, round, and reactive  to light. Conjunctivae are normal.  Right eye slightly red in sclera  Cardiovascular: Normal rate, regular rhythm and normal heart sounds.  Pulmonary/Chest: Effort normal and breath sounds normal.  Neurological: She is alert and oriented to person, place, and time. Gait normal. Gait normal.  Skin: Skin is warm and dry.  Psychiatric: Mood, memory, affect and judgment normal.  Nursing note and vitals reviewed. normal formation right foot bone with clavi to right lateral foot   Assessment   1. Granulomatous disease lungs, liver/spleen of unknown etiology could be atypical fungal infection as pt lived in the [place], TB gold negative less likely TB 2. Seasonal allergies/allergic rhinitis  3. Callus right foot, laterally  4. HM Plan  1.  Will disc with GI to see if they are concerned but currently I am not concerned reassured pt and likely benign etiology asymptomatic  2. rec take OTC antihistamines qhs, OTC eye drops for allergies for now  If not better disc  allergist consult  3.  Disc wider shoes and pads for callus given info pt also to show dermatology in 01/2018  4.  Flu and Tdap UTD rec hep B vaccine pt to consider  Consider shingrix vaccine   Mammogram neg 10/25/17 neeed to get copy of pap from Dr. Corinna Capra. Pt also had my risk screening 10/21/17 and negative  Colonoscopy pt wants to sch for fall and will let me know who her husband saw that who she wants to see  F/u dermatology 01/2018   Provider: Dr. Olivia Mackie McLean-Scocuzza-Internal Medicine

## 2018-06-20 ENCOUNTER — Telehealth: Payer: Self-pay

## 2018-06-20 NOTE — Telephone Encounter (Signed)
Who did her husbands she wanted to go to the same person  Call pt and let me know  [place]

## 2018-06-20 NOTE — Telephone Encounter (Signed)
Copied from [place] [id number]. Topic: General - Other >> Jun 20, 2018 11:44 AM Alfredia Ferguson R wrote: Patient is calling in wanting she schedule her 1st colonoscopy she states she was advised to contact pcp

## 2018-06-21 NOTE — Telephone Encounter (Signed)
Can you contact this patient please

## 2018-06-23 ENCOUNTER — Other Ambulatory Visit: Payer: Self-pay | Admitting: Internal Medicine

## 2018-06-23 DIAGNOSIS — Z1211 Encounter for screening for malignant neoplasm of colon: Secondary | ICD-10-CM

## 2018-06-23 DIAGNOSIS — J8410 Pulmonary fibrosis, unspecified: Secondary | ICD-10-CM

## 2018-06-23 DIAGNOSIS — K753 Granulomatous hepatitis, not elsewhere classified: Secondary | ICD-10-CM

## 2018-06-23 NOTE — Telephone Encounter (Signed)
Referrals sent GI and lung  [place]

## 2018-06-23 NOTE — Telephone Encounter (Signed)
Dr. Vicente Males for GI and she wants a referral to pulmonologist.

## 2018-06-24 ENCOUNTER — Encounter: Payer: Self-pay | Admitting: Internal Medicine

## 2018-06-27 NOTE — Telephone Encounter (Signed)
Yes, it has been corrected per my previous message. Thanks [employer]

## 2018-06-27 NOTE — Progress Notes (Unsigned)
The referral did not have the diagnosis for the colonoscopy so I don't think that they saw it when it was scheduled. You did type it in the comment field though. I have entered the colonoscopy in the referral and sent them a message to update the appointment for her. Thanks! Melissa

## 2018-06-29 ENCOUNTER — Encounter: Payer: Self-pay | Admitting: Pulmonary Disease

## 2018-06-29 ENCOUNTER — Ambulatory Visit: Payer: BC Managed Care – PPO | Admitting: Pulmonary Disease

## 2018-06-29 VITALS — BP 108/68 | HR 82 | Ht 69.0 in | Wt 142.0 lb

## 2018-06-29 DIAGNOSIS — Z23 Encounter for immunization: Secondary | ICD-10-CM | POA: Diagnosis not present

## 2018-06-29 DIAGNOSIS — J8410 Pulmonary fibrosis, unspecified: Secondary | ICD-10-CM

## 2018-06-29 NOTE — Patient Instructions (Addendum)
1) You grew up and an endemic area for histoplasmosis which is the [facility]. Your CT scan of the chest is consistent with previous exposure to histoplasmosis and granulomatous disease due to the same. There is no treatment necessary at this point this is a normal response of the body to exposure to histoplasmosis. We will see you here on an as-needed basis  2) We discussed the dangers of exposures to bats, just make sure that you're not exposed to their droppings as they also carry histoplasmosis. I understand that your building is currently being treated to eradicate a colony of bats.  3) You received that the flu vaccine today.

## 2018-06-29 NOTE — Progress Notes (Signed)
Subjective:    Patient ID: Kara Spencer, female    DOB: 1966/01/08, 52 y.o.   MRN: [id number]  HPI patient is a 52 year old lifelong never smoker, who presents for evaluation of an abnormal imaging of the lung. Patient was noted to have lung granulomas. The patient is kindly referred by Dr. Olivia Mackie McLean-Scocuzza. Findings were noted incidentally on a chest x-ray performed in December 2018 and confirmed by chest CT performed in January 2019. I have reviewed those films independently. Findings are consistent with all granulomatous disease.  The patient has been totally asymptomatic with regards to this issue. She is active and has not had any constitutional symptoms. She has not had any fevers, chills or sweats. She has no cough or sputum production and no hemoptysis. There is currently no issues with chest pain, paroxysmal nocturnal dyspnea or orthopnea. No lower extremity edema.   The patient recalls that she has been told by ophthalmologist that she has "spots in the back of her eyes".  As noted she is a lifelong never smoker.The patient was born and grew up in [place]. She has also traveled out [place]. She does not have any unusual hobbies. 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 They are contemplating moving to a different building.  She currently has no pets. She has no hot tubs in the home. No issues with mold syndicalists in the home.  Past medical history, surgical history, family history has been reviewed in detail.    Review of Systems  Constitutional: Negative.   HENT: Negative.   Eyes: Negative.   Respiratory: Negative.   Cardiovascular: Negative.   Gastrointestinal: Negative.   Endocrine: Negative.   Genitourinary: Negative.   Musculoskeletal: Negative.   Skin: Negative.   Allergic/Immunologic: Negative.   Neurological: Negative.   Hematological:  Negative.   Psychiatric/Behavioral: Negative.        Objective:   Physical Exam  Constitutional: She is oriented to person, place, and time. She appears well-developed and well-nourished. No distress.  HENT:  Head: Normocephalic and atraumatic.  Right Ear: External ear normal.  Left Ear: External ear normal.  Nose: Nose normal.  Mouth/Throat: Oropharynx is clear and moist.  Eyes: Pupils are equal, round, and reactive to light. Conjunctivae and EOM are normal. No scleral icterus.  Neck: Normal range of motion. Neck supple. No tracheal deviation present. No thyromegaly present.  Cardiovascular: Normal rate, regular rhythm and normal heart sounds. Exam reveals no gallop and no friction rub.  No murmur heard. Pulmonary/Chest: Effort normal and breath sounds normal. No respiratory distress. She has no wheezes. She has no rales.  Abdominal: She exhibits no distension.  Musculoskeletal: Normal range of motion. She exhibits no edema or deformity.  Lymphadenopathy:    She has no cervical adenopathy.  Neurological: She is alert and oriented to person, place, and time. No cranial nerve deficit.  Skin: Skin is warm and dry. No rash noted. She is not diaphoretic. No erythema. No pallor.  Psychiatric: She has a normal mood and affect. Thought content normal.  Nursing note and vitals reviewed.   Review of the CT scan of January 2019 shows that she has granulomas that are calcified scattered in the periphery of the right upper and lower lobes the lung. 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 The patient was born and raised in an endemic area which is the [facility]. 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 She also has evidence of granulomas in the liver and spleen which are characteristic of this. This indicates normal response of the body to fungal infection and does not indicate active infection. The patient is exposed to bats in her work environment and she has been advised not to come in contact with bat droppings as these also are potential source for Histoplasma. She states that currently there is an exterminator trying to contain the issue. From our standpoint there is nothing further to do, the patient can be followed on an as needed basis. She was instructed however, that should she develop issues with immune compromise she would have to be monitored closely due to her prior exposure.  2) Patient received influenza vaccine today.   Thank you for allowing me to participate in this patient's care.

## 2018-06-30 ENCOUNTER — Encounter: Payer: Self-pay | Admitting: Internal Medicine

## 2018-06-30 ENCOUNTER — Other Ambulatory Visit: Payer: Self-pay | Admitting: Internal Medicine

## 2018-06-30 DIAGNOSIS — Z1211 Encounter for screening for malignant neoplasm of colon: Secondary | ICD-10-CM

## 2018-07-11 IMAGING — CT CT CHEST W/ CM
2 of 4 series · 12 of 36 positions shown, 15 images · IV contrast (iopamidol)
Comparison: No priors.

CLINICAL DATA: 51-year-old female with history of funny sensation
intermittently a left-sided chest for the past 7 years.

EXAM:
CT CHEST WITH CONTRAST
TECHNIQUE: Multidetector CT imaging of the chest was performed during
intravenous contrast administration.
CONTRAST:  75mL 9DYDST-3LL IOPAMIDOL (9DYDST-3LL) INJECTION 61%

[Series 18: thorax 2.00 br40 s3 ax · axial · 0.48mm/px · z∈[-1382,-1080]mm · 9 of 179 slices shown, 12 images]
[im 14/179  mediastinal]
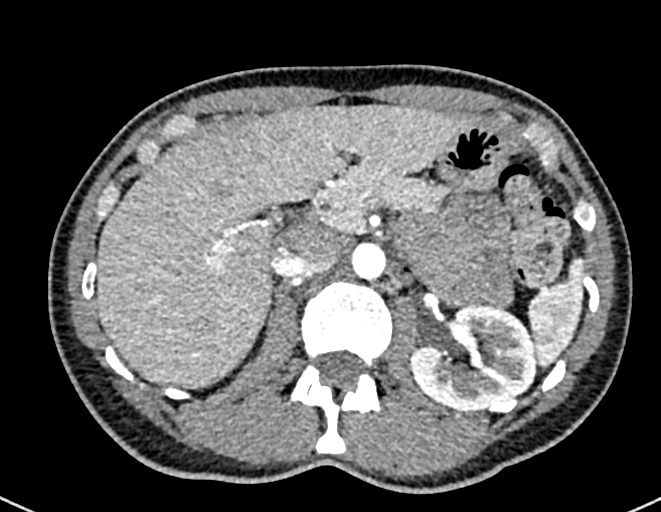
[im 14/179  lung]
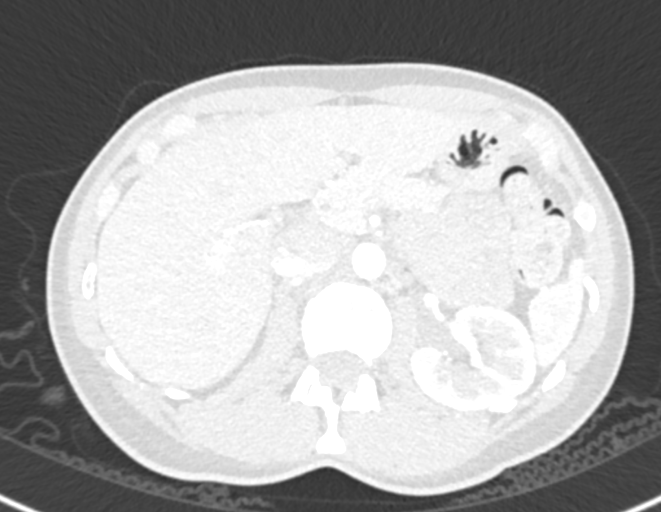
[im 42/179  lung]
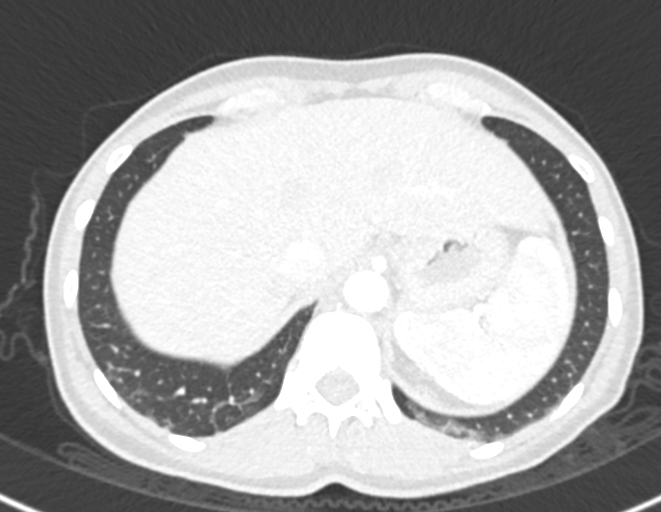
[im 55/179  lung]
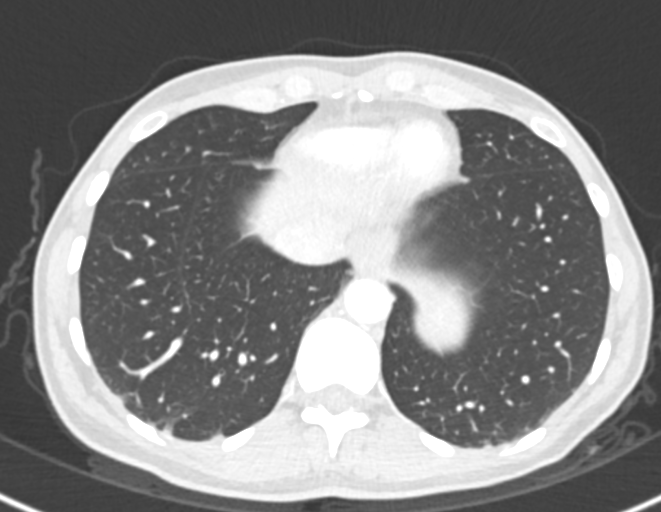
[im 69/179  lung]
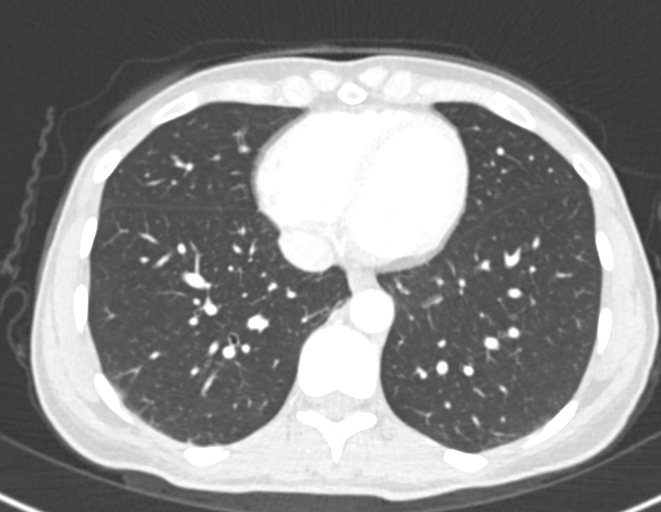
[im 96/179  mediastinal]
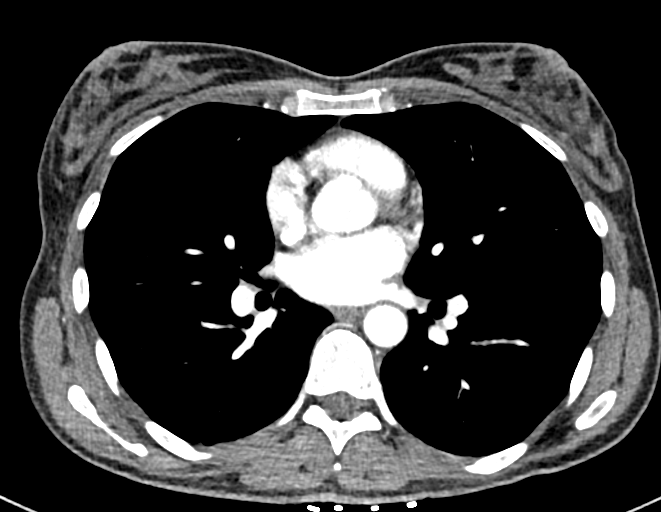
[im 96/179  lung]
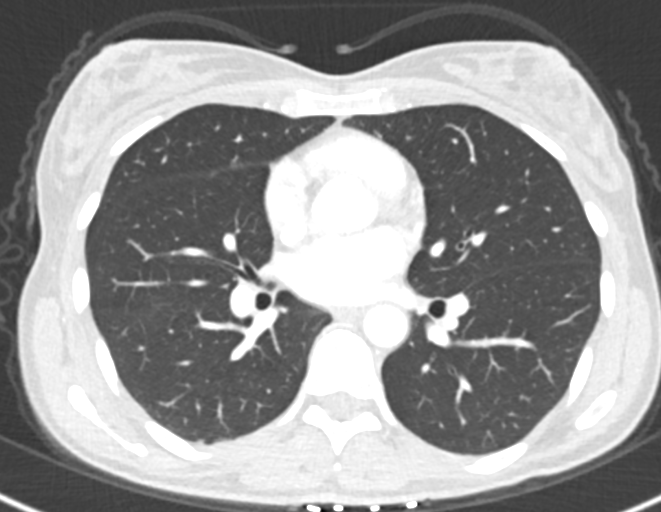
[im 110/179  lung]
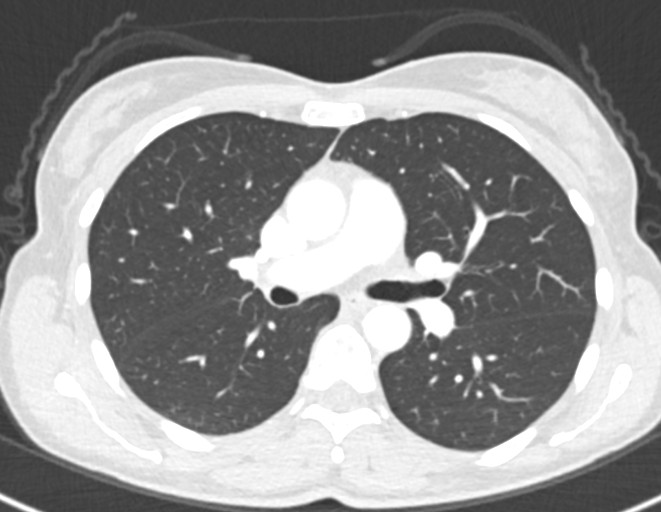
[im 124/179  lung]
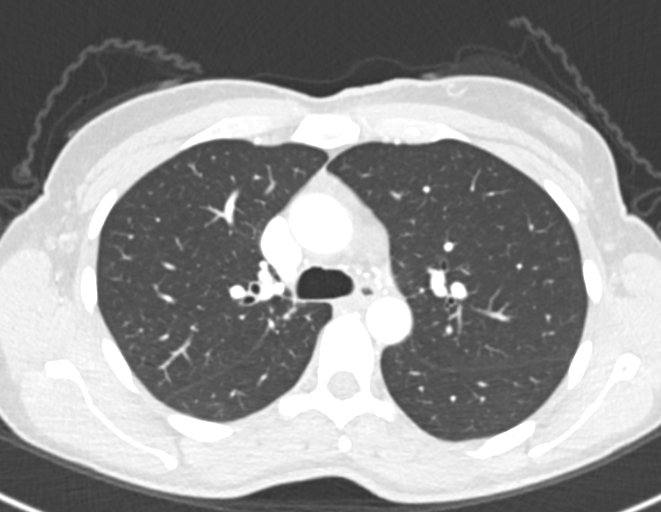
[im 151/179  lung]
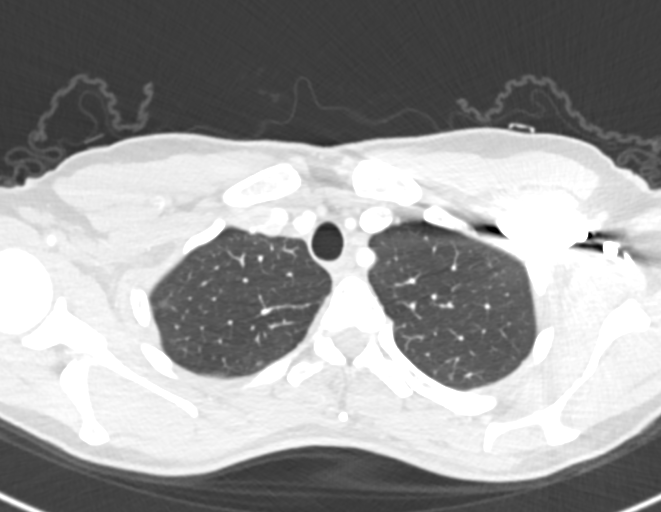
[im 165/179  mediastinal]
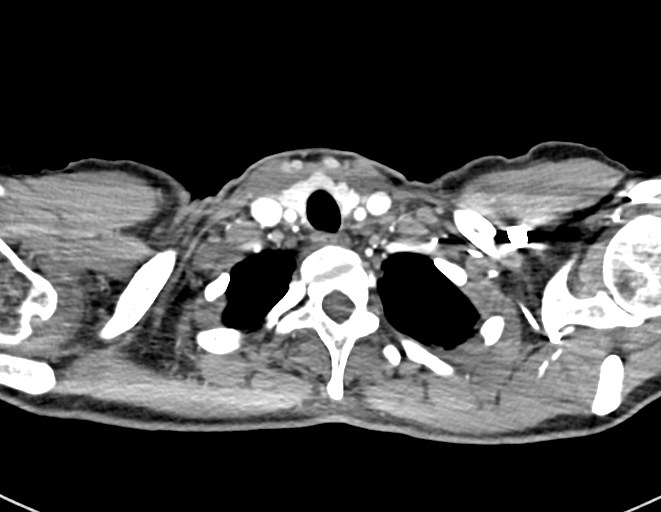
[im 165/179  lung]
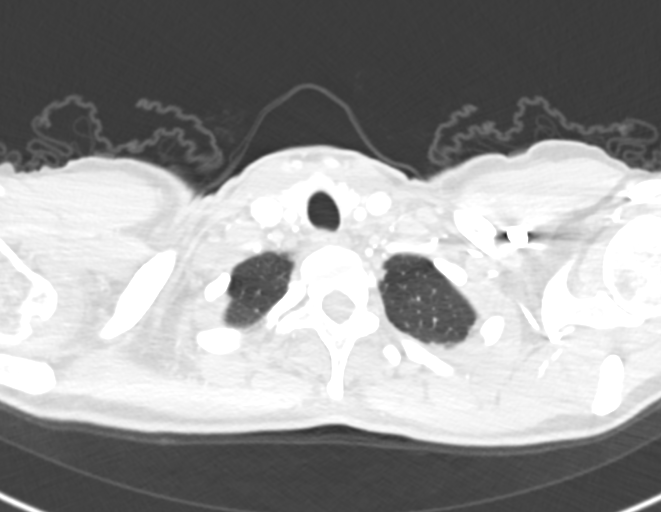

[Series 20: thorax 2.00 br40 s3 cor · coronal · 0.62mm/px · 3 of 122 slices shown]
[im 25/122  lung]
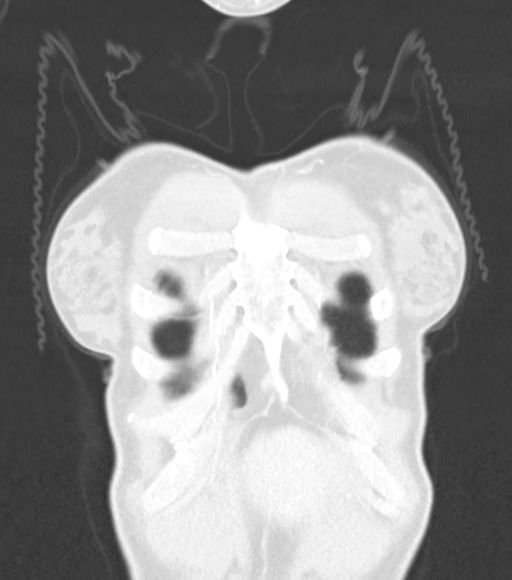
[im 49/122  lung]
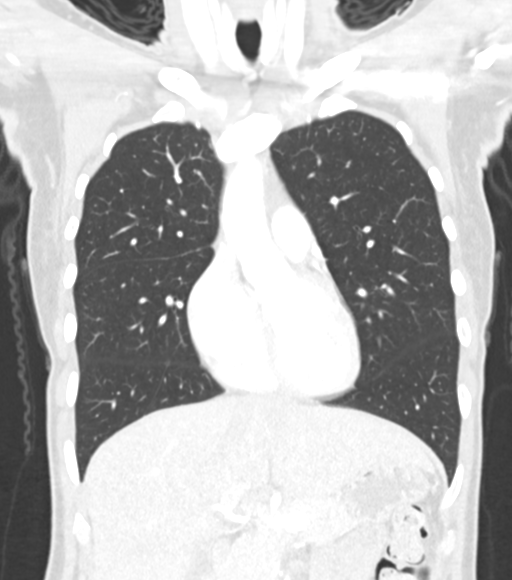
[im 73/122  lung]
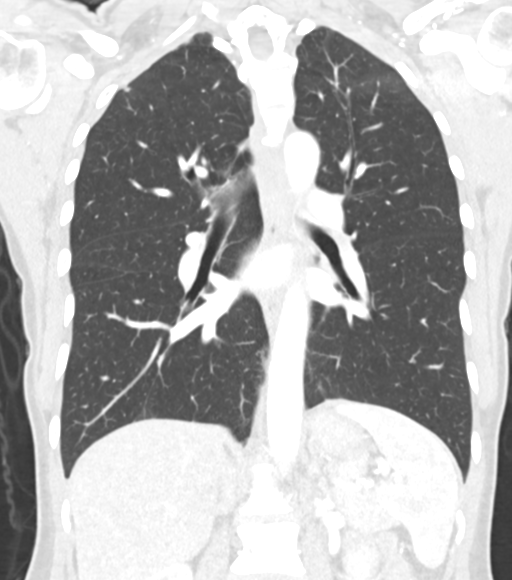

[12 of 36 positions shown; findings below may reference images not displayed]

FINDINGS: Cardiovascular: Heart size is normal. There is no significant
pericardial fluid, thickening or pericardial calcification. No
significant atherosclerosis in the thoracic aorta. No coronary
artery calcifications.

Mediastinum/Nodes: No pathologically enlarged mediastinal or hilar
lymph nodes. Esophagus is unremarkable in appearance. No axillary
lymphadenopathy.

Lungs/Pleura: Calcified granulomas in the periphery of the right
upper and lower lobes. No other suspicious appearing pulmonary
nodules or masses are noted. No acute consolidative airspace
disease. No pleural effusions.

Upper Abdomen: Small calcified granulomas scattered throughout the
liver and spleen. Subcentimeter low-attenuation lesion in segment 6
of the liver, too small to characterize, but statistically likely a
tiny cyst.

Musculoskeletal: There are no aggressive appearing lytic or blastic
lesions noted in the visualized portions of the skeleton.
IMPRESSION: 1. No acute findings in the thorax to account for the patient's
symptoms.
2. Old granulomatous disease, as above.

## 2018-07-13 ENCOUNTER — Encounter: Payer: Self-pay | Admitting: Gastroenterology

## 2018-07-13 ENCOUNTER — Ambulatory Visit: Payer: BC Managed Care – PPO | Admitting: Gastroenterology

## 2018-07-27 ENCOUNTER — Encounter: Payer: Self-pay | Admitting: *Deleted

## 2018-08-12 ENCOUNTER — Other Ambulatory Visit: Payer: Self-pay

## 2018-08-12 DIAGNOSIS — Z1211 Encounter for screening for malignant neoplasm of colon: Secondary | ICD-10-CM

## 2018-10-03 ENCOUNTER — Encounter: Payer: Self-pay | Admitting: Student

## 2018-10-04 ENCOUNTER — Encounter: Payer: Self-pay | Admitting: *Deleted

## 2018-10-04 ENCOUNTER — Ambulatory Visit: Payer: BC Managed Care – PPO | Admitting: Anesthesiology

## 2018-10-04 ENCOUNTER — Ambulatory Visit
Admission: RE | Admit: 2018-10-04 | Discharge: 2018-10-04 | Disposition: A | Discharge status: Home / Self Care | Payer: BC Managed Care – PPO | Attending: Gastroenterology | Admitting: Gastroenterology

## 2018-10-04 ENCOUNTER — Encounter
Admission: RE | Disposition: A | Discharge status: Home / Self Care | Payer: Self-pay | Source: Home / Self Care | Attending: Gastroenterology

## 2018-10-04 DIAGNOSIS — Z1211 Encounter for screening for malignant neoplasm of colon: Secondary | ICD-10-CM

## 2018-10-04 DIAGNOSIS — K640 First degree hemorrhoids: Secondary | ICD-10-CM | POA: Insufficient documentation

## 2018-10-04 HISTORY — PX: COLONOSCOPY WITH PROPOFOL: SHX5780

## 2018-10-04 SURGERY — COLONOSCOPY WITH PROPOFOL
Anesthesia: General

## 2018-10-04 MED ORDER — PROPOFOL 500 MG/50ML IV EMUL
INTRAVENOUS | Status: AC
  Filled 2018-10-04: qty 50

## 2018-10-04 MED ORDER — PROPOFOL 500 MG/50ML IV EMUL
INTRAVENOUS | Status: DC | PRN
  Administered 2018-10-04: 80 ug/kg/min via INTRAVENOUS

## 2018-10-04 MED ORDER — SODIUM CHLORIDE 0.9 % IV SOLN
INTRAVENOUS | Status: DC
  Administered 2018-10-04: 1000 mL via INTRAVENOUS
  Administered 2018-10-04: 11:00:00 via INTRAVENOUS

## 2018-10-04 MED ORDER — PROPOFOL 10 MG/ML IV BOLUS
INTRAVENOUS | Status: DC | PRN
  Administered 2018-10-04 (×2): 50 mg via INTRAVENOUS
  Administered 2018-10-04: 40 mg via INTRAVENOUS
  Administered 2018-10-04: 20 mg via INTRAVENOUS
  Administered 2018-10-04: 30 mg via INTRAVENOUS
  Administered 2018-10-04: 50 mg via INTRAVENOUS
  Administered 2018-10-04: 30 mg via INTRAVENOUS

## 2018-10-04 NOTE — Anesthesia Post-op Follow-up Note (Signed)
Anesthesia QCDR form completed.        

## 2018-10-04 NOTE — Anesthesia Preprocedure Evaluation (Signed)
Anesthesia Evaluation  Patient identified by MRN, date of birth, ID band Patient awake    Reviewed: Allergy & Precautions, H&P , NPO status , Patient's Chart, lab work & pertinent test results, reviewed documented beta blocker date and time   Airway Mallampati: II   Neck ROM: full    Dental  (+) Poor Dentition   Pulmonary neg pulmonary ROS,    Pulmonary exam normal        Cardiovascular Exercise Tolerance: Good negative cardio ROS Normal cardiovascular exam Rhythm:regular Rate:Normal     Neuro/Psych negative neurological ROS  negative psych ROS   GI/Hepatic negative GI ROS, Neg liver ROS,   Endo/Other  negative endocrine ROS  Renal/GU negative Renal ROS  negative genitourinary   Musculoskeletal   Abdominal   Peds  Hematology negative hematology ROS (+)   Anesthesia Other Findings Past Medical History: No date: Dysplastic nevi     Comment:  s/p removal 07/30/17 and exc sch 10/04/17 trunk No date: Gluten intolerance     Comment:  Wheat, caicin No date: History of chicken pox Past Surgical History: 1973: TONSILLECTOMY AND ADENOIDECTOMY BMI    Body Mass Index:  20.97 kg/m     Reproductive/Obstetrics negative OB ROS                             Anesthesia Physical Anesthesia Plan  ASA: II  Anesthesia Plan: General   Post-op Pain Management:    Induction:   PONV Risk Score and Plan:   Airway Management Planned:   Additional Equipment:   Intra-op Plan:   Post-operative Plan:   Informed Consent: I have reviewed the patients History and Physical, chart, labs and discussed the procedure including the risks, benefits and alternatives for the proposed anesthesia with the patient or authorized representative who has indicated his/her understanding and acceptance.     Dental Advisory Given  Plan Discussed with: CRNA  Anesthesia Plan Comments:         Anesthesia Quick  Evaluation

## 2018-10-04 NOTE — Anesthesia Postprocedure Evaluation (Signed)
Anesthesia Post Note  Patient: Kara Spencer  Procedure(s) Performed: COLONOSCOPY WITH PROPOFOL (N/A )  Patient location during evaluation: Endoscopy Anesthesia Type: General Level of consciousness: awake and alert Pain management: pain level controlled Vital Signs Assessment: post-procedure vital signs reviewed and stable Respiratory status: spontaneous breathing, nonlabored ventilation, respiratory function stable and patient connected to nasal cannula oxygen Cardiovascular status: blood pressure returned to baseline and stable Postop Assessment: no apparent nausea or vomiting Anesthetic complications: no     Last Vitals:  Vitals:   10/04/18 1148 10/04/18 1158  BP: 104/77 112/79  Pulse: 68 68  Resp: 14 15  Temp:    SpO2: 100% 100%    Last Pain:  Vitals:   10/04/18 1158  TempSrc:   PainSc: 0-No pain                 Precious Haws Clarece Drzewiecki

## 2018-10-04 NOTE — H&P (Signed)
Kara Lame, MD [address] [place], [place] [address] Phone: [phone] Fax : [phone]  Primary Care Physician:  McLean-Scocuzza, Nino Glow, MD Primary Gastroenterologist:  Dr. Allen Norris  Pre-Procedure History & Physical: HPI:  Kara Spencer is a 53 y.o. female is here for a screening colonoscopy.   Past Medical History:  Diagnosis Date  . Dysplastic nevi    s/p removal 07/30/17 and exc sch 10/04/17 trunk  . Gluten intolerance    Wheat, caicin  . History of chicken pox     Past Surgical History:  Procedure Laterality Date  . TONSILLECTOMY AND ADENOIDECTOMY  1973    Prior to Admission medications   Medication Sig Start Date End Date Taking? Authorizing Provider  Ascorbic Acid (VITAMIN C) 1000 MG tablet Take 1,000 mg by mouth daily.     [provider]  ASTAXANTHIN PO Take 12 mg by mouth daily.    [provider]  Calcium-Magnesium-Vitamin D (CALCIUM MAGNESIUM PO) Take by mouth daily.    [provider]  DIGESTIVE ENZYMES PO Take 1 tablet by mouth 3 (three) times daily.    [provider]  Misc Natural Products (CHLORELLA) 500 MG CAPS Take 500 mg by mouth daily.    [provider]  Multiple Vitamin (MULTIVITAMIN) capsule Take 1 capsule by mouth 2 (two) times daily.     [provider]  NON FORMULARY phophatidyl choline 385 mg x 1     [provider]  NON FORMULARY Calcium/magnesium 500mg /250 x 2    [provider]  NON FORMULARY biocell Collagen 1000 mg qd  Per pt all supplements per Dr. Carney Living    [provider]  Nutritional Supplements (PYCNOGENOL) 30 MG CAPS Take 1 capsule by mouth daily.    [provider]  Omega-3 Fatty Acids (OMEGA 3 PO) Take 800 mg by mouth. Liquid form. At bedtime    [provider]  Probiotic Product (PROBIOTIC DAILY PO) Take 1 tablet by mouth daily.    [provider]  VITAMIN D, CHOLECALCIFEROL, PO Take 5,000 Units by mouth.     [provider]    Allergies as of 08/12/2018 - Review Complete 06/29/2018  Allergen Reaction Noted  . Other Swelling 08/17/2017  . Casein Diarrhea 06/29/2018  . Sesame oil  08/17/2017  . Wheat bran  04/01/2017    Family History  Problem Relation Age of Onset  . Lung cancer Mother   . Stroke Mother   . Cancer Mother        lung cancer smoker   . Lung cancer Father   . Cancer Father        lung cancer - smoker  . Cancer Paternal Aunt        ovarian cancer  . Early death Sister        automobile accident  . Heart disease Paternal Grandfather   . Thyroid disease Maternal Grandmother     Social History   Socioeconomic History  . Marital status: Married    Spouse name: Not on file  . Number of children: 1  . Years of education: 80  . Highest education level: Not on file  Occupational History  . Occupation: Market researcher: [employer]  Social Needs  . Financial resource strain: Not on file  . Food insecurity:    Worry: Not on file    Inability: Not on file  . Transportation needs:    Medical: Not on file  Non-medical: Not on file  Tobacco Use  . Smoking status: Never Smoker  . Smokeless tobacco: Never Used  Substance and Sexual Activity  . Alcohol use: Yes    Comment: 2-3 times a month  . Drug use: No  . Sexual activity: Yes  Lifestyle  . Physical activity:    Days per week: Not on file    Minutes per session: Not on file  . Stress: Not on file  Relationships  . Social connections:    Talks on phone: Not on file    Gets together: Not on file    Attends religious service: Not on file    Active member of club or organization: Not on file    Attends meetings of clubs or organizations: Not on file    Relationship status: Not on file  . Intimate partner violence:    Fear of current or ex partner: Not on file    Emotionally abused: Not on file    Physically abused: Not on file    Forced sexual activity: Not on file  Other  Topics Concern  . Not on file  Social History Narrative   Kashay grew up in [place]. She attended [employer] and obtained her Paediatric nurse in Communication Disorders. She then obtained her Masters in Sport and exercise psychologist from [employer]. She currently lives in [place] with her husband and daughter (as of 07/2017 daughter is 7.5 y.o)    Review of Systems: See HPI, otherwise negative ROS  Physical Exam: BP 123/80   Pulse 82   Temp (!) 97.1 F (36.2 C) (Tympanic)   Resp 18   Ht 5\' 9"  (1.753 m)   Wt 64.4 kg   SpO2 100%   BMI 20.97 kg/m  General:   Alert,  pleasant and cooperative in NAD Head:  Normocephalic and atraumatic. Neck:  Supple; no masses or thyromegaly. Lungs:  Clear throughout to auscultation.    Heart:  Regular rate and rhythm. Abdomen:  Soft, nontender and nondistended. Normal bowel sounds, without guarding, and without rebound.   Neurologic:  Alert and  oriented x4;  grossly normal neurologically.  Impression/Plan: Kara Spencer is now here to undergo a screening colonoscopy.  Risks, benefits, and alternatives regarding colonoscopy have been reviewed with the patient.  Questions have been answered.  All parties agreeable.

## 2018-10-04 NOTE — Op Note (Signed)
[facility] Gastroenterology Patient Name: Kara Spencer Procedure Date: 10/04/2018 10:53 AM MRN: [id number] Account #: [id number] Date of Birth: 02-Apr-1966 Admit Type: Outpatient Age: 53 Room: [facility] ENDO ROOM 4 Gender: Female Note Status: Finalized Procedure:            Colonoscopy Indications:          Screening for colorectal malignant neoplasm Providers:            Lucilla Lame MD, MD Referring MD:         Nino Glow Mclean-Scocuzza MD, MD (Referring MD) Medicines:            Propofol per Anesthesia Complications:        No immediate complications. Procedure:            Pre-Anesthesia Assessment:                       - Prior to the procedure, a History and Physical was                        performed, and patient medications and allergies were                        reviewed. The patient's tolerance of previous                        anesthesia was also reviewed. The risks and benefits of                        the procedure and the sedation options and risks were                        discussed with the patient. All questions were                        answered, and informed consent was obtained. Prior                        Anticoagulants: The patient has taken no previous                        anticoagulant or antiplatelet agents. ASA Grade                        Assessment: II - A patient with mild systemic disease.                        After reviewing the risks and benefits, the patient was                        deemed in satisfactory condition to undergo the                        procedure.                       After obtaining informed consent, the colonoscope was                        passed under direct vision. Throughout the procedure,  the patient's blood pressure, pulse, and oxygen                        saturations were monitored continuously. The                        Colonoscope was introduced through the anus and                         advanced to the the cecum, identified by appendiceal                        orifice and ileocecal valve. The colonoscopy was                        performed without difficulty. The patient tolerated the                        procedure well. The quality of the bowel preparation                        was excellent. Findings:      The perianal and digital rectal examinations were normal.      Non-bleeding internal hemorrhoids were found during retroflexion. The       hemorrhoids were Grade I (internal hemorrhoids that do not prolapse). Impression:           - Non-bleeding internal hemorrhoids.                       - No specimens collected. Recommendation:       - Discharge patient to home.                       - Resume previous diet.                       - Continue present medications.                       - Repeat colonoscopy in 10 years for screening unless                        any change in family history or lower GI problems. Procedure Code(s):    --- Professional ---                       [phone], Colonoscopy, flexible; diagnostic, including                        collection of specimen(s) by brushing or washing, when                        performed (separate procedure) Diagnosis Code(s):    --- Professional ---                       Z12.11, Encounter for screening for malignant neoplasm                        of colon CPT copyright 2018 American Medical Association. All rights reserved. The codes documented in this report are preliminary and upon coder review may  be revised to meet current compliance requirements. Lucilla Lame MD, MD 10/04/2018 11:26:24 AM This report has been signed electronically. Number of Addenda: 0 Note Initiated On: 10/04/2018 10:53 AM Scope Withdrawal Time: 0 hours 6 minutes 31 seconds  Total Procedure Duration: 0 hours 19 minutes 31 seconds       [facility]

## 2018-10-04 NOTE — Transfer of Care (Signed)
Immediate Anesthesia Transfer of Care Note  Patient: Kara Spencer  Procedure(s) Performed: COLONOSCOPY WITH PROPOFOL (N/A )  Patient Location: PACU  Anesthesia Type:General  Level of Consciousness: awake  Airway & Oxygen Therapy: Patient Spontanous Breathing  Post-op Assessment: Report given to RN  Post vital signs: stable  Last Vitals:  Vitals Value Taken Time  BP 93/70 10/04/2018 11:29 AM  Temp 36.2 C 10/04/2018 11:28 AM  Pulse 79 10/04/2018 11:30 AM  Resp 18 10/04/2018 11:30 AM  SpO2 97 % 10/04/2018 11:30 AM  Vitals shown include unvalidated device data.  Last Pain:  Vitals:   10/04/18 1128  TempSrc: Tympanic  PainSc: 0-No pain         Complications: No apparent anesthesia complications

## 2018-10-05 ENCOUNTER — Encounter: Payer: Self-pay | Admitting: Gastroenterology

## 2019-04-06 NOTE — Telephone Encounter (Signed)
LG please advise.  Form has been placed in your folder for review.

## 2019-04-11 ENCOUNTER — Encounter: Payer: Self-pay | Admitting: Internal Medicine

## 2019-04-12 NOTE — Telephone Encounter (Signed)
Dr. Patsey Berthold please advise. Thanks. Pt has additional questions regarding her lung dx.

## 2019-07-21 ENCOUNTER — Encounter: Payer: Self-pay | Admitting: Internal Medicine

## 2020-01-22 LAB — HM MAMMOGRAPHY

## 2020-01-30 ENCOUNTER — Ambulatory Visit: Payer: BC Managed Care – PPO | Admitting: Dermatology

## 2020-01-30 ENCOUNTER — Other Ambulatory Visit: Payer: Self-pay

## 2020-01-30 ENCOUNTER — Encounter: Payer: Self-pay | Admitting: Dermatology

## 2020-01-30 DIAGNOSIS — L570 Actinic keratosis: Secondary | ICD-10-CM | POA: Diagnosis not present

## 2020-01-30 DIAGNOSIS — D225 Melanocytic nevi of trunk: Secondary | ICD-10-CM

## 2020-01-30 DIAGNOSIS — D229 Melanocytic nevi, unspecified: Secondary | ICD-10-CM

## 2020-01-30 DIAGNOSIS — L738 Other specified follicular disorders: Secondary | ICD-10-CM

## 2020-01-30 DIAGNOSIS — D2361 Other benign neoplasm of skin of right upper limb, including shoulder: Secondary | ICD-10-CM

## 2020-01-30 DIAGNOSIS — L814 Other melanin hyperpigmentation: Secondary | ICD-10-CM

## 2020-01-30 DIAGNOSIS — L821 Other seborrheic keratosis: Secondary | ICD-10-CM

## 2020-01-30 DIAGNOSIS — Z86018 Personal history of other benign neoplasm: Secondary | ICD-10-CM

## 2020-01-30 DIAGNOSIS — D2372 Other benign neoplasm of skin of left lower limb, including hip: Secondary | ICD-10-CM | POA: Diagnosis not present

## 2020-01-30 DIAGNOSIS — L578 Other skin changes due to chronic exposure to nonionizing radiation: Secondary | ICD-10-CM

## 2020-01-30 DIAGNOSIS — Z1283 Encounter for screening for malignant neoplasm of skin: Secondary | ICD-10-CM | POA: Diagnosis not present

## 2020-01-30 DIAGNOSIS — L820 Inflamed seborrheic keratosis: Secondary | ICD-10-CM

## 2020-01-30 DIAGNOSIS — L601 Onycholysis: Secondary | ICD-10-CM

## 2020-01-30 DIAGNOSIS — D2371 Other benign neoplasm of skin of right lower limb, including hip: Secondary | ICD-10-CM | POA: Diagnosis not present

## 2020-01-30 DIAGNOSIS — Z85828 Personal history of other malignant neoplasm of skin: Secondary | ICD-10-CM

## 2020-01-30 MED ORDER — TAVABOROLE 5 % EX SOLN: CUTANEOUS | 5 refills | Status: DC

## 2020-01-30 NOTE — Progress Notes (Signed)
Follow-Up Visit   Subjective  Kara Spencer is a 54 y.o. female who presents for the following: Follow-up (6 mo f/u - TBSE). She has a few spots to check on her nose, right preauricular, and scalp that are new and changing.  She would also like her fingernails checked. She states that after using nail polish strips, her nails have a different texture and are lifting up.  The following portions of the chart were reviewed this encounter and updated as appropriate:      Review of Systems:  No other skin or systemic complaints except as noted in HPI or Assessment and Plan.  Objective  Well appearing patient in no apparent distress; mood and affect are within normal limits.  A full examination was performed including scalp, head, eyes, ears, nose, lips, neck, chest, axillae, abdomen, back, buttocks, bilateral upper extremities, bilateral lower extremities, hands, feet, fingers, toes, fingernails, and toenails. All findings within normal limits unless otherwise noted below.  Objective  R nasal tip x 1: Pink scaly macule  Objective  Right neck, R lower neck, L mid lower back: Well healed scar with no evidence of recurrence.   Objective  L upper abd, L post shoulder, L sternum, R spinal lower back: Scar with no evidence of recurrence.   Objective  R preauricular x 1, R zygoma x 1, R frontal hairline x 1 (3): Waxy flesh papules with mild scale and erythema  Objective  Back, abd: Brown macules and papules.  See photos  Mid Back: Mid upper back 5.19mm light brown speckled macule  Images        Objective  Fingernails, Toenails: Onycholysis on bil 5th fingernails; mild onycholysis on other fingernails and bil great toenails, with mild yellow brown discoloration gt toenails.   Assessment & Plan   Skin cancer screening performed today.  Actinic Damage - diffuse scaly erythematous macules with underlying dyspigmentation - Recommend daily broad spectrum sunscreen SPF 30+  to sun-exposed areas, reapply every 2 hours as needed.  - Call for new or changing lesions.  Melanocytic Nevi - Tan-brown and/or pink-flesh-colored symmetric macules and papules - Benign appearing on exam today - Observation - Call clinic for new or changing moles - Recommend daily use of broad spectrum spf 30+ sunscreen to sun-exposed areas.   Lentigines - Scattered tan macules - Discussed due to sun exposure - Benign, observe - Call for any changes  Sebaceous Hyperplasia - Small yellow papules with a central dell - Benign - Observe Seborrheic Keratoses - Stuck-on, waxy, tan-brown papules and plaques  - Discussed benign etiology and prognosis. - Observe - Call for any changes  Dermatofibroma - Firm pink/brown papulenodule with dimple sign - right forearm, right upper     knee, left post ankle - Benign appearing - Call for any changes  AK (actinic keratosis) R nasal tip x 1  Destruction of lesion - R nasal tip x 1  Destruction method: cryotherapy   Informed consent: discussed and consent obtained   Lesion destroyed using liquid nitrogen: Yes   Region frozen until ice ball extended beyond lesion: Yes   Outcome: patient tolerated procedure well with no complications   Post-procedure details: wound care instructions given    History of basal cell carcinoma (BCC) Right neck, R lower neck, L mid lower back  Clear. Observe for recurrence. Call clinic for new or changing lesions.  Recommend regular skin exams, daily broad-spectrum spf 30+ sunscreen use, and photoprotection.     History of dysplastic nevus  L upper abd, L post shoulder, L sternum, R spinal lower back  Clear. Observe for recurrence. Call clinic for new or changing lesions.  Recommend regular skin exams, daily broad-spectrum spf 30+ sunscreen use, and photoprotection.     Inflamed seborrheic keratosis (3) R preauricular x 1, R zygoma x 1, R frontal hairline x 1  Destruction of lesion - R preauricular x  1, R zygoma x 1, R frontal hairline x 1  Destruction method: cryotherapy   Informed consent: discussed and consent obtained   Lesion destroyed using liquid nitrogen: Yes   Region frozen until ice ball extended beyond lesion: Yes   Outcome: patient tolerated procedure well with no complications   Post-procedure details: wound care instructions given    Nevus (2) Back, abd; Mid Back  Benign-appearing.  Observation.  Call clinic for new or changing moles.  Recommend daily use of broad spectrum spf 30+ sunscreen to sun-exposed areas.    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 Sample given.  If not covered, will send in [place]. Discussed terbinafine. Patient prefers to start with topical.  Tavaborole (KERYDIN) 5 % SOLN - Fingernails, Toenails  Return in about 6 months (around 08/01/2020) for TBSE.   IJamesetta Orleans, CMA, am acting as scribe for Brendolyn Patty, MD .  Documentation: I have reviewed the above documentation for accuracy and completeness, and I agree with the above.  Brendolyn Patty MD

## 2020-01-30 NOTE — Patient Instructions (Signed)
Cryotherapy Aftercare  . Wash gently with soap and water everyday.   . Apply Vaseline and Band-Aid daily until healed.  

## 2020-08-05 ENCOUNTER — Encounter: Payer: Self-pay | Admitting: Dermatology

## 2020-08-05 ENCOUNTER — Other Ambulatory Visit: Payer: Self-pay

## 2020-08-05 ENCOUNTER — Ambulatory Visit: Payer: BC Managed Care – PPO | Admitting: Dermatology

## 2020-08-05 DIAGNOSIS — L578 Other skin changes due to chronic exposure to nonionizing radiation: Secondary | ICD-10-CM

## 2020-08-05 DIAGNOSIS — D2371 Other benign neoplasm of skin of right lower limb, including hip: Secondary | ICD-10-CM

## 2020-08-05 DIAGNOSIS — Z1283 Encounter for screening for malignant neoplasm of skin: Secondary | ICD-10-CM | POA: Diagnosis not present

## 2020-08-05 DIAGNOSIS — D2361 Other benign neoplasm of skin of right upper limb, including shoulder: Secondary | ICD-10-CM

## 2020-08-05 DIAGNOSIS — D225 Melanocytic nevi of trunk: Secondary | ICD-10-CM | POA: Diagnosis not present

## 2020-08-05 DIAGNOSIS — D2372 Other benign neoplasm of skin of left lower limb, including hip: Secondary | ICD-10-CM | POA: Diagnosis not present

## 2020-08-05 DIAGNOSIS — L820 Inflamed seborrheic keratosis: Secondary | ICD-10-CM | POA: Diagnosis not present

## 2020-08-05 DIAGNOSIS — Z86018 Personal history of other benign neoplasm: Secondary | ICD-10-CM

## 2020-08-05 DIAGNOSIS — L219 Seborrheic dermatitis, unspecified: Secondary | ICD-10-CM

## 2020-08-05 DIAGNOSIS — L821 Other seborrheic keratosis: Secondary | ICD-10-CM

## 2020-08-05 DIAGNOSIS — L570 Actinic keratosis: Secondary | ICD-10-CM

## 2020-08-05 DIAGNOSIS — D229 Melanocytic nevi, unspecified: Secondary | ICD-10-CM

## 2020-08-05 DIAGNOSIS — D239 Other benign neoplasm of skin, unspecified: Secondary | ICD-10-CM

## 2020-08-05 DIAGNOSIS — L814 Other melanin hyperpigmentation: Secondary | ICD-10-CM

## 2020-08-05 DIAGNOSIS — D492 Neoplasm of unspecified behavior of bone, soft tissue, and skin: Secondary | ICD-10-CM

## 2020-08-05 HISTORY — DX: Other benign neoplasm of skin, unspecified: D23.9

## 2020-08-05 NOTE — Progress Notes (Signed)
Follow-Up Visit   Subjective  Kara Spencer is a 54 y.o. female who presents for the following: Annual Exam (total body skin exam, hx of BCC, Dysplastic Nevi, Ak), scaly patch (R forehead, >76m), bump (R forhehead x 50yr, no symptoms), and growths (face, pt would like removed). She tends to pick at them and are bothersome.   The following portions of the chart were reviewed this encounter and updated as appropriate:      Review of Systems:  No other skin or systemic complaints except as noted in HPI or Assessment and Plan.  Objective  Well appearing patient in no apparent distress; mood and affect are within normal limits.  A full examination was performed including scalp, head, eyes, ears, nose, lips, neck, chest, axillae, abdomen, back, buttocks, bilateral upper extremities, bilateral lower extremities, hands, feet, fingers, toes, fingernails, and toenails. All findings within normal limits unless otherwise noted below.  Objective  Left calf, R upper knee, R forearm: Firm pink/brown papulenodule with dimple sign.   Objective  Left upper Abdomen: 4.20mm speckled brown macule     Objective  Right eyebrow: Mild erythema, has been scaly in past per pt  Objective  R forehead x 1, L sideburn x 1, L temple x 1, anterior neck x 1 (4): Erythematous keratotic or waxy stuck-on papule or plaque.   Objective  Nose, chest: Pink scaly macules    Assessment & Plan  Dermatofibroma Left calf, R upper knee, R forearm  Benign, observe.    Neoplasm of skin Left upper Abdomen  Epidermal / dermal shaving  Lesion diameter (cm):  0.6 Informed consent: discussed and consent obtained   Patient was prepped and draped in usual sterile fashion: area prepped with alcohol. Anesthesia: the lesion was anesthetized in a standard fashion   Anesthetic:  1% lidocaine w/ epinephrine 1-100,000 buffered w/ 8.4% NaHCO3 Instrument used: flexible razor blade   Hemostasis achieved with: pressure,  aluminum chloride and electrodesiccation   Outcome: patient tolerated procedure well   Post-procedure details: wound care instructions given   Post-procedure details comment:  Ointment and small bandage applied  Specimen 1 - Surgical pathology Differential Diagnosis: D48.5 Nevus vs Dysplastic Nevus Check Margins: yes 4.4mm speckled brown macule  Seborrheic dermatitis Right eyebrow  Start Eucrisa oint qd/bid, sample x 1 Lot SBGN 06/24  Inflamed seborrheic keratosis (4) R forehead x 1, L sideburn x 1, L temple x 1, anterior neck x 1  Destruction of lesion - R forehead x 1, L sideburn x 1, L temple x 1, anterior neck x 1 Complexity: simple   Destruction method: cryotherapy   Informed consent: discussed and consent obtained   Lesion destroyed using liquid nitrogen: Yes   Region frozen until ice ball extended beyond lesion: Yes   Outcome: patient tolerated procedure well with no complications   Post-procedure details: wound care instructions given    AK (actinic keratosis) Nose, chest  Recommend PDT with ALA to the face x 2 treatments 1 month apart, also to the chest.   Seborrheic Keratoses - Stuck-on, waxy, tan-brown papules and plaques  - Discussed benign etiology and prognosis. - Observe - Call for any changes  Lentigines - Scattered tan macules - Discussed due to sun exposure - Benign, observe - Call for any changes  Melanocytic Nevi - Tan-brown and/or pink-flesh-colored symmetric macules and papules - Benign appearing on exam today - Observation - Call clinic for new or changing moles - Recommend daily use of broad spectrum spf 30+ sunscreen to  sun-exposed areas.   Skin cancer screening performed today.  History of Basal Cell Carcinoma of the Skin - No evidence of recurrence today - Recommend regular full body skin exams - Recommend daily broad spectrum sunscreen SPF 30+ to sun-exposed areas, reapply every 2 hours as needed.  - Call if any new or changing  lesions are noted between office visits - L mid lower back, R lower neck, R neck  History of Dysplastic Nevi - No evidence of recurrence today - Recommend regular full body skin exams - Recommend daily broad spectrum sunscreen SPF 30+ to sun-exposed areas, reapply every 2 hours as needed.  - Call if any new or changing lesions are noted between office visits  Actinic Damage - Severe, chronic, secondary to cumulative UV radiation exposure over time - diffuse scaly erythematous macules and papules with underlying dyspigmentation face, chest - Discussed "Field Treatment" for Severe, Confluent Actinic Changes with Pre-Cancerous Actinic Keratoses Field treatment involves treatment of an entire area of skin that has confluent Actinic Changes (Sun/ Ultraviolet light damage) and PreCancerous Actinic Keratoses by method of PhotoDynamic Therapy (PDT) and/or prescription Topical Chemotherapy agents such as 5-fluorouracil, 5-fluorouracil/calcipotriene, and/or imiquimod.  The purpose is to decrease the number of clinically evident and subclinical PreCancerous lesions to prevent progression to development of skin cancer by chemically destroying early precancer changes that may or may not be visible.  It has been shown to reduce the risk of developing skin cancer in the treated area. As a result of treatment, redness, scaling, crusting, and open sores may occur during treatment course. One or more than one of these methods may be used and may have to be used several times to control, suppress and eliminate the PreCancerous changes. Discussed treatment course, expected reaction, and possible side effects. - Recommend daily broad spectrum sunscreen SPF 30+ to sun-exposed areas, reapply every 2 hours as needed.  - Call for new or changing lesions.  Return 6 mos, skin check, for PDT x 2 treatments to face, 1 month apart, and PDT x 2 treatments to chest/neck 1m apart, 4m TBSE.  I, Othelia Pulling, RMA, am acting as  scribe for Brendolyn Patty, MD . Documentation: I have reviewed the above documentation for accuracy and completeness, and I agree with the above.  Brendolyn Patty MD

## 2020-08-05 NOTE — Patient Instructions (Addendum)
For dry spot right eyebrow Start Eucrisa oint once or twice a day when scaly    Wound Care Instructions  1. Cleanse wound gently with soap and water once a day then pat dry with clean gauze. Apply a thing coat of Petrolatum (petroleum jelly, "Vaseline") over the wound (unless you have an allergy to this). We recommend that you use a new, sterile tube of Vaseline. Do not pick or remove scabs. Do not remove the yellow or white "healing tissue" from the base of the wound.  2. Cover the wound with fresh, clean, nonstick gauze and secure with paper tape. You may use Band-Aids in place of gauze and tape if the would is small enough, but would recommend trimming much of the tape off as there is often too much. Sometimes Band-Aids can irritate the skin.  3. You should call the office for your biopsy report after 1 week if you have not already been contacted.  4. If you experience any problems, such as abnormal amounts of bleeding, swelling, significant bruising, significant pain, or evidence of infection, please call the office immediately.  5. FOR ADULT SURGERY PATIENTS: If you need something for pain relief you may take 1 extra strength Tylenol (acetaminophen) AND 2 Ibuprofen (200mg  each) together every 4 hours as needed for pain. (do not take these if you are allergic to them or if you have a reason you should not take them.) Typically, you may only need pain medication for 1 to 3 days.

## 2020-08-07 ENCOUNTER — Telehealth: Payer: Self-pay

## 2020-08-07 NOTE — Telephone Encounter (Signed)
Left message for patient to call for bx results.  

## 2020-08-07 NOTE — Telephone Encounter (Signed)
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

## 2020-08-07 NOTE — Telephone Encounter (Signed)
Left pt msg to call for bx results/sh 

## 2020-08-07 NOTE — Telephone Encounter (Signed)
Patient advised of BX results and scheduled for surgery.  

## 2020-08-13 ENCOUNTER — Other Ambulatory Visit: Payer: Self-pay

## 2020-08-13 ENCOUNTER — Ambulatory Visit: Payer: BC Managed Care – PPO

## 2020-08-13 DIAGNOSIS — L570 Actinic keratosis: Secondary | ICD-10-CM

## 2020-08-13 MED ORDER — AMINOLEVULINIC ACID HCL 20 % EX SOLR
1.0000 "application " | Freq: Once | CUTANEOUS | Status: AC
  Administered 2020-08-13: 354 mg via TOPICAL

## 2020-08-13 NOTE — Progress Notes (Signed)
Patient completed PDT therapy today.  1. AK (actinic keratosis) Chest and Neck  Photodynamic therapy - Chest and Neck Procedure discussed: discussed risks, benefits, side effects. and alternatives   Prep: site scrubbed/prepped with acetone   Location:  Chest and neck Number of lesions:  Multiple Type of treatment:  Blue light Aminolevulinic Acid (see MAR for details): Levulan Number of Levulan sticks used:  1 Number of minutes under lamp:  16 Number of seconds under lamp:  40 Cooling:  Floor fan Post-procedure details: sunscreen applied

## 2020-08-27 ENCOUNTER — Encounter: Payer: Self-pay | Admitting: Internal Medicine

## 2020-08-27 ENCOUNTER — Other Ambulatory Visit: Payer: Self-pay

## 2020-08-27 ENCOUNTER — Ambulatory Visit (INDEPENDENT_AMBULATORY_CARE_PROVIDER_SITE_OTHER): Payer: BC Managed Care – PPO | Admitting: Internal Medicine

## 2020-08-27 VITALS — BP 112/70 | HR 80 | Temp 98.4°F | Ht 69.29 in | Wt 142.2 lb

## 2020-08-27 DIAGNOSIS — E538 Deficiency of other specified B group vitamins: Secondary | ICD-10-CM | POA: Diagnosis not present

## 2020-08-27 DIAGNOSIS — H6121 Impacted cerumen, right ear: Secondary | ICD-10-CM

## 2020-08-27 DIAGNOSIS — Z1329 Encounter for screening for other suspected endocrine disorder: Secondary | ICD-10-CM

## 2020-08-27 DIAGNOSIS — Z23 Encounter for immunization: Secondary | ICD-10-CM

## 2020-08-27 DIAGNOSIS — Z1389 Encounter for screening for other disorder: Secondary | ICD-10-CM

## 2020-08-27 DIAGNOSIS — Z0000 Encounter for general adult medical examination without abnormal findings: Secondary | ICD-10-CM | POA: Diagnosis not present

## 2020-08-27 DIAGNOSIS — E559 Vitamin D deficiency, unspecified: Secondary | ICD-10-CM

## 2020-08-27 DIAGNOSIS — N3281 Overactive bladder: Secondary | ICD-10-CM

## 2020-08-27 DIAGNOSIS — R202 Paresthesia of skin: Secondary | ICD-10-CM

## 2020-08-27 DIAGNOSIS — K148 Other diseases of tongue: Secondary | ICD-10-CM

## 2020-08-27 NOTE — Patient Instructions (Addendum)
Bring covid vaccine card back  Alpha lipoic acid 600 mg 2x per day    Neuropathic Pain Neuropathic pain is pain caused by damage to the nerves that are responsible for certain sensations in your body (sensory nerves). The pain can be caused by:  Damage to the sensory nerves that send signals to your spinal cord and brain (peripheral nervous system).  Damage to the sensory nerves in your brain or spinal cord (central nervous system). Neuropathic pain can make you more sensitive to pain. Even a minor sensation can feel very painful. This is usually a long-term condition that can be difficult to treat. The type of pain differs from person to person. It may:  Start suddenly (acute), or it may develop slowly and last for a long time (chronic).  Come and go as damaged nerves heal, or it may stay at the same level for years.  Cause emotional distress, loss of sleep, and a lower quality of life. What are the causes? The most common cause of this condition is diabetes. Many other diseases and conditions can also cause neuropathic pain. Causes of neuropathic pain can be classified as:  Toxic. This is caused by medicines and chemicals. The most common cause of toxic neuropathic pain is damage from cancer treatments (chemotherapy).  Metabolic. This can be caused by: ? Diabetes. This is the most common disease that damages the nerves. ? Lack of vitamin B from long-term alcohol abuse.  Traumatic. Any injury that cuts, crushes, or stretches a nerve can cause damage and pain. A common example is feeling pain after losing an arm or leg (phantom limb pain).  Compression-related. If a sensory nerve gets trapped or compressed for a long period of time, the blood supply to the nerve can be cut off.  Vascular. Many blood vessel diseases can cause neuropathic pain by decreasing blood supply and oxygen to nerves.  Autoimmune. This type of pain results from diseases in which the body's defense system (immune  system) mistakenly attacks sensory nerves. Examples of autoimmune diseases that can cause neuropathic pain include lupus and multiple sclerosis.  Infectious. Many types of viral infections can damage sensory nerves and cause pain. Shingles infection is a common cause of this type of pain.  Inherited. Neuropathic pain can be a symptom of many diseases that are passed down through families (genetic). What increases the risk? You are more likely to develop this condition if:  You have diabetes.  You smoke.  You drink too much alcohol.  You are taking certain medicines, including medicines that kill cancer cells (chemotherapy) or that treat immune system disorders. What are the signs or symptoms? The main symptom is pain. Neuropathic pain is often described as:  Burning.  Shock-like.  Stinging.  Hot or cold.  Itching. How is this diagnosed? No single test can diagnose neuropathic pain. It is diagnosed based on:  Physical exam and your symptoms. Your health care provider will ask you about your pain. You may be asked to use a pain scale to describe how bad your pain is.  Tests. These may be done to see if you have a high sensitivity to pain and to help find the cause and location of any sensory nerve damage. They include: ? Nerve conduction studies to test how well nerve signals travel through your sensory nerves (electrodiagnostic testing). ? Stimulating your sensory nerves through electrodes on your skin and measuring the response in your spinal cord and brain (somatosensory evoked potential).  Imaging studies, such as: ?  X-rays. ? CT scan. ? MRI. How is this treated? Treatment for neuropathic pain may change over time. You may need to try different treatment options or a combination of treatments. Some options include:  Treating the underlying cause of the neuropathy, such as diabetes, kidney disease, or vitamin deficiencies.  Stopping medicines that can cause neuropathy,  such as chemotherapy.  Medicine to relieve pain. Medicines may include: ? Prescription or over-the-counter pain medicine. ? Anti-seizure medicine. ? Antidepressant medicines. ? Pain-relieving patches that are applied to painful areas of skin. ? A medicine to numb the area (local anesthetic), which can be injected as a nerve block.  Transcutaneous nerve stimulation. This uses electrical currents to block painful nerve signals. The treatment is painless.  Alternative treatments, such as: ? Acupuncture. ? Meditation. ? Massage. ? Physical therapy. ? Pain management programs. ? Counseling. Follow these instructions at home: Medicines   Take over-the-counter and prescription medicines only as told by your health care provider.  Do not drive or use heavy machinery while taking prescription pain medicine.  If you are taking prescription pain medicine, take actions to prevent or treat constipation. Your health care provider may recommend that you: ? Drink enough fluid to keep your urine pale yellow. ? Eat foods that are high in fiber, such as fresh fruits and vegetables, whole grains, and beans. ? Limit foods that are high in fat and processed sugars, such as fried or sweet foods. ? Take an over-the-counter or prescription medicine for constipation. Lifestyle   Have a good support system at home.  Consider joining a chronic pain support group.  Do not use any products that contain nicotine or tobacco, such as cigarettes and e-cigarettes. If you need help quitting, ask your health care provider.  Do not drink alcohol. General instructions  Learn as much as you can about your condition.  Work closely with all your health care providers to find the treatment plan that works best for you.  Ask your health care provider what activities are safe for you.  Keep all follow-up visits as told by your health care provider. This is important. Contact a health care provider if:  Your  pain treatments are not working.  You are having side effects from your medicines.  You are struggling with tiredness (fatigue), mood changes, depression, or anxiety. Summary  Neuropathic pain is pain caused by damage to the nerves that are responsible for certain sensations in your body (sensory nerves).  Neuropathic pain may come and go as damaged nerves heal, or it may stay at the same level for years.  Neuropathic pain is usually a long-term condition that can be difficult to treat. Consider joining a chronic pain support group. This information is not intended to replace advice given to you by your health care provider. Make sure you discuss any questions you have with your health care provider. Document Revised: 12/29/2018 Document Reviewed: 09/24/2017 Elsevier Patient Education  [place].   Overactive Bladder, Adult  Overactive bladder refers to a condition in which a person has a sudden need to pass urine. The person may leak urine if he or she cannot get to the bathroom fast enough (urinary incontinence). A person with this condition may also wake up several times in the night to go to the bathroom. Overactive bladder is associated with poor nerve signals between your bladder and your brain. Your bladder may get the signal to empty before it is full. You may also have very sensitive muscles that  make your bladder squeeze too soon. These symptoms might interfere with daily work or social activities. What are the causes? This condition may be associated with or caused by:  Urinary tract infection.  Infection of nearby tissues, such as the prostate.  Prostate enlargement.  Surgery on the uterus or urethra.  Bladder stones, inflammation, or tumors.  Drinking too much caffeine or alcohol.  Certain medicines, especially medicines that get rid of extra fluid in the body (diuretics).  Muscle or nerve weakness, especially from: ? A spinal cord  injury. ? Stroke. ? Multiple sclerosis. ? Parkinson's disease.  Diabetes.  Constipation. What increases the risk? You may be at greater risk for overactive bladder if you:  Are an older adult.  Smoke.  Are going through menopause.  Have prostate problems.  Have a neurological disease, such as stroke, dementia, Parkinson's disease, or multiple sclerosis (MS).  Eat or drink things that irritate the bladder. These include alcohol, spicy food, and caffeine.  Are overweight or obese. What are the signs or symptoms? Symptoms of this condition include:  Sudden, strong urge to urinate.  Leaking urine.  Urinating 8 or more times a day.  Waking up to urinate 2 or more times a night. How is this diagnosed? Your health care provider may suspect overactive bladder based on your symptoms. He or she will diagnose this condition by:  A physical exam and medical history.  Blood or urine tests. You might need bladder or urine tests to help determine what is causing your overactive bladder. You might also need to see a health care provider who specializes in urinary tract problems (urologist). How is this treated? Treatment for overactive bladder depends on the cause of your condition and whether it is mild or severe. You can also make lifestyle changes at home. Options include:  Bladder training. This may include: ? Learning to control the urge to urinate by following a schedule that directs you to urinate at regular intervals (timed voiding). ? Doing Kegel exercises to strengthen your pelvic floor muscles, which support your bladder. Toning these muscles can help you control urination, even if your bladder muscles are overactive.  Special devices. This may include: ? Biofeedback, which uses sensors to help you become aware of your body's signals. ? Electrical stimulation, which uses electrodes placed inside the body (implanted) or outside the body. These electrodes send gentle pulses  of electricity to strengthen the nerves or muscles that control the bladder. ? Women may use a plastic device that fits into the vagina and supports the bladder (pessary).  Medicines. ? Antibiotics to treat bladder infection. ? Antispasmodics to stop the bladder from releasing urine at the wrong time. ? Tricyclic antidepressants to relax bladder muscles. ? Injections of botulinum toxin type A directly into the bladder tissue to relax bladder muscles.  Lifestyle changes. This may include: ? Weight loss. Talk to your health care provider about weight loss methods that would work best for you. ? Diet changes. This may include reducing how much alcohol and caffeine you consume, or drinking fluids at different times of the day. ? Not smoking. Do not use any products that contain nicotine or tobacco, such as cigarettes and e-cigarettes. If you need help quitting, ask your health care provider.  Surgery. ? A device may be implanted to help manage the nerve signals that control urination. ? An electrode may be implanted to stimulate electrical signals in the bladder. ? A procedure may be done to change the shape of  the bladder. This is done only in very severe cases. Follow these instructions at home: Lifestyle  Make any diet or lifestyle changes that are recommended by your health care provider. These may include: ? Drinking less fluid or drinking fluids at different times of the day. ? Cutting down on caffeine or alcohol. ? Doing Kegel exercises. ? Losing weight if needed. ? Eating a healthy and balanced diet to prevent constipation. This may include:  Eating foods that are high in fiber, such as fresh fruits and vegetables, whole grains, and beans.  Limiting foods that are high in fat and processed sugars, such as fried and sweet foods. General instructions  Take over-the-counter and prescription medicines only as told by your health care provider.  If you were prescribed an antibiotic  medicine, take it as told by your health care provider. Do not stop taking the antibiotic even if you start to feel better.  Use any implants or pessary as told by your health care provider.  If needed, wear pads to absorb urine leakage.  Keep a journal or log to track how much and when you drink and when you feel the need to urinate. This will help your health care provider monitor your condition.  Keep all follow-up visits as told by your health care provider. This is important. Contact a health care provider if:  You have a fever.  Your symptoms do not get better with treatment.  Your pain and discomfort get worse.  You have more frequent urges to urinate. Get help right away if:  You are not able to control your bladder. Summary  Overactive bladder refers to a condition in which a person has a sudden need to pass urine.  Several conditions may lead to an overactive bladder.  Treatment for overactive bladder depends on the cause and severity of your condition.  Follow your health care provider's instructions about lifestyle changes, doing Kegel exercises, keeping a journal, and taking medicines. This information is not intended to replace advice given to you by your health care provider. Make sure you discuss any questions you have with your health care provider. Document Revised: 12/29/2018 Document Reviewed: 09/23/2017 Elsevier Patient Education  [place].

## 2020-08-27 NOTE — Progress Notes (Signed)
shingrix

## 2020-08-27 NOTE — Progress Notes (Signed)
Chief Complaint  Patient presents with  . Annual Exam   Annual  1. C/o lump under tongue x a long time 10.5 years and seeing dental and likely oral surgery for this  Disc with rads can do CT neck w/o contrast to work this up if no infection of abscess suspected  Having pain 3/10  2. Left big toe pain worse with sitting at times intermittent x 6 months pain is sharp but also she can be standing or sitting in bed  -MRI in the past negative for MS -also disc consider Neurology for NCS/EMG  3. Right ear wax with reduce hearing 4. C/o overactive bladder goes Q1 hrs tried meds for constipation to see if this helps but does not urology w/u in the past negative she does drink a lot of water    Review of Systems  Constitutional: Negative for weight loss.  HENT:       +mass under tongue   Eyes: Negative for blurred vision.  Respiratory: Negative for shortness of breath.   Cardiovascular: Negative for chest pain.  Gastrointestinal: Negative for abdominal pain.  Genitourinary: Positive for frequency.  Musculoskeletal: Negative for falls and joint pain.  Skin: Negative for rash.  Neurological: Positive for tingling.  Psychiatric/Behavioral: Negative for depression.   Past Medical History:  Diagnosis Date  . Actinic keratosis   . Basal cell carcinoma 02/2010   Left mid lower back  . Basal cell carcinoma 2016   Right lower neck  . Basal cell carcinoma 09/25/2019   Right neck, ED[facility]  . Dysplastic nevi    s/p removal 07/30/17  . Dysplastic nevi 09/18/2019   left upper abdomen/excision  . Dysplastic nevi 08/08/2019   left post shoulder/mod  . Dysplastic nevi 10/04/2017   left sternum/excision  . Dysplastic nevi 02/2010   R spinal lower back  . Dysplastic nevus 08/05/2020   L upper abdomen, severe  . Gluten intolerance    Wheat, caicin  . History of chicken pox    Past Surgical History:  Procedure Laterality Date  . COLONOSCOPY WITH PROPOFOL N/A 10/04/2018   Procedure: COLONOSCOPY  WITH PROPOFOL;  Surgeon: Lucilla Lame, MD;  Location: [facility] ENDOSCOPY;  Service: Endoscopy;  Laterality: N/A;  . TONSILLECTOMY AND ADENOIDECTOMY  1973   Family History  Problem Relation Age of Onset  . Lung cancer Mother        died 04-27-2018   . Stroke Mother   . Cancer Mother        lung cancer smoker   . Lung cancer Father   . Cancer Father        lung cancer - smoker  . Cancer Paternal Aunt        ovarian cancer  . Early death Sister        automobile accident  . Heart disease Paternal Grandfather   . Thyroid disease Maternal Grandmother    Social History   Socioeconomic History  . Marital status: Married    Spouse name: Not on file  . Number of children: 1  . Years of education: 5  . Highest education level: Not on file  Occupational History  . Occupation: Market researcher: [employer]  Tobacco Use  . Smoking status: Never Smoker  . Smokeless tobacco: Never Used  Substance and Sexual Activity  . Alcohol use: Yes    Comment: 2-3 times a month  . Drug use: No  . Sexual activity: Yes  Other Topics Concern  . Not  on file  Social History Narrative   Donelda grew up in [place]. She attended [employer] and obtained her Paediatric nurse in Communication Disorders. She then obtained her Masters in Sport and exercise psychologist from [employer]. She currently lives in [place] with her husband and daughter (as of 07/2017 daughter is 34.5 y.o)   Social Determinants of Health   Financial Resource Strain: Not on file  Food Insecurity: Not on file  Transportation Needs: Not on file  Physical Activity: Not on file  Stress: Not on file  Social Connections: Not on file  Intimate Partner Violence: Not on file   Current Meds  Medication Sig  . Ascorbic Acid (VITAMIN C) 1000 MG tablet Take 1,000 mg by mouth daily.   . ASTAXANTHIN PO Take 12 mg by mouth daily.  . Black Cohosh 540 MG CAPS Take 540 mg by mouth in the morning, at noon,  and at bedtime.  . Calcium-Magnesium-Vitamin D (CALCIUM MAGNESIUM PO) Take by mouth daily.  Marland Kitchen DIGESTIVE ENZYMES PO Take 1 tablet by mouth 3 (three) times daily.  . medroxyPROGESTERone (PROVERA) 10 MG tablet medroxyprogesterone 10 mg tablet  TAKE 1 TABLET EVERY DAY BY ORAL ROUTE AT BEDTIME FOR 10 DAYS.  Marland Kitchen Misc Natural Products (CHLORELLA) 500 MG CAPS Take 500 mg by mouth daily.  . Multiple Vitamin (MULTIVITAMIN) capsule Take 1 capsule by mouth 2 (two) times daily.   . NON FORMULARY phophatidyl choline 385 mg x 1   . NON FORMULARY Calcium/magnesium 500mg /250 x 2  . NON FORMULARY biocell Collagen 1000 mg qd  Per pt all supplements per Dr. Carney Living  . Nutritional Supplements (PYCNOGENOL) 30 MG CAPS Take 1 capsule by mouth daily.  . Omega-3 Fatty Acids (OMEGA 3 PO) Take 800 mg by mouth. Liquid form. At bedtime  . Probiotic Product (PROBIOTIC DAILY PO) Take 1 tablet by mouth daily.  Marland Kitchen VITAMIN D, CHOLECALCIFEROL, PO Take 50 mcg by mouth daily in the afternoon.    Allergies  Allergen Reactions  . Other Swelling    MSG>swelling  Other allergies casein joint digestive, sesame ears red and jt pain, hemp/tapioca gluten digestive, amaruth, sorghum, buckwheat>gluten digestive.  Per pt this was done via blood tests with Dr. Carney Living   . Casein Diarrhea  . Sesame Oil   . Wheat Bran     Joint pain, digestive Grains she can't tolerate: Amaranth, sorghum, buckwheat, tapioca   Recent Results (from the past 2160 hour(s))  Urine Culture     Status: None   Collection Time: 08/30/20  8:01 AM   Specimen: Urine  Result Value Ref Range   MICRO NUMBER: [id number]    SPECIMEN QUALITY: Adequate    Sample Source NOT GIVEN    STATUS: FINAL    Result:      Growth of mixed flora was isolated, suggesting probable contamination. No further testing will be performed. If clinically indicated, recollection using a method to minimize contamination, with prompt transfer to Urine Culture Transport Tube, is  recommended.    Vitamin D (25 hydroxy)     Status: None   Collection Time: 08/30/20  8:01 AM  Result Value Ref Range   VITD 71.32 30.00 - 100.00 ng/mL  Vitamin B12     Status: None   Collection Time: 08/30/20  8:01 AM  Result Value Ref Range   Vitamin B-12 687 211 - 911 pg/mL  Urinalysis, Routine w reflex microscopic     Status: None   Collection Time: 08/30/20  8:01  AM  Result Value Ref Range   Color, Urine YELLOW YELLOW   APPearance CLEAR CLEAR   Specific Gravity, Urine 1.011 1.001 - 1.03   pH 7.0 5.0 - 8.0   Glucose, UA NEGATIVE NEGATIVE   Bilirubin Urine NEGATIVE NEGATIVE   Ketones, ur NEGATIVE NEGATIVE   Hgb urine dipstick NEGATIVE NEGATIVE   Protein, ur NEGATIVE NEGATIVE   Nitrite NEGATIVE NEGATIVE   Leukocytes,Ua NEGATIVE NEGATIVE  TSH     Status: None   Collection Time: 08/30/20  8:01 AM  Result Value Ref Range   TSH 2.88 0.35 - 4.50 uIU/mL  CBC with Differential/Platelet     Status: Abnormal   Collection Time: 08/30/20  8:01 AM  Result Value Ref Range   WBC 4.0 4.0 - 10.5 K/uL   RBC 4.63 3.87 - 5.11 Mil/uL   Hemoglobin 14.4 12.0 - 15.0 g/dL   HCT 43.4 36.0 - 46.0 %   MCV 93.6 78.0 - 100.0 fl   MCHC 33.3 30.0 - 36.0 g/dL   RDW 12.6 11.5 - 15.5 %   Platelets 178.0 150.0 - 400.0 K/uL   Neutrophils Relative % 37.4 (L) 43.0 - 77.0 %   Lymphocytes Relative 43.9 12.0 - 46.0 %   Monocytes Relative 13.8 (H) 3.0 - 12.0 %   Eosinophils Relative 4.4 0.0 - 5.0 %   Basophils Relative 0.5 0.0 - 3.0 %   Neutro Abs 1.5 1.4 - 7.7 K/uL   Lymphs Abs 1.8 0.7 - 4.0 K/uL   Monocytes Absolute 0.6 0.1 - 1.0 K/uL   Eosinophils Absolute 0.2 0.0 - 0.7 K/uL   Basophils Absolute 0.0 0.0 - 0.1 K/uL  Lipid panel     Status: Abnormal   Collection Time: 08/30/20  8:01 AM  Result Value Ref Range   Cholesterol 191 0 - 200 mg/dL    Comment: ATP III Classification       Desirable:  < 200 mg/dL               Borderline High:  200 - 239 mg/dL          High:  > = 240 mg/dL   Triglycerides 53.0 0.0 - 149.0  mg/dL    Comment: Normal:  <150 mg/dLBorderline High:  150 - 199 mg/dL   HDL 66.90 >39.00 mg/dL   VLDL 10.6 0.0 - 40.0 mg/dL   LDL Cholesterol 114 (H) 0 - 99 mg/dL   Total CHOL/HDL Ratio 3     Comment:                Men          Women1/2 Average Risk     3.4          3.3Average Risk          5.0          4.42X Average Risk          9.6          7.13X Average Risk          15.0          11.0                       NonHDL 124.36     Comment: NOTE:  Non-HDL goal should be 30 mg/dL higher than patient's LDL goal (i.e. LDL goal of < 70 mg/dL, would have non-HDL goal of < 100 mg/dL)  Comprehensive metabolic panel     Status: None  Collection Time: 08/30/20  8:01 AM  Result Value Ref Range   Sodium 142 135 - 145 mEq/L   Potassium 4.0 3.5 - 5.1 mEq/L   Chloride 102 96 - 112 mEq/L   CO2 32 19 - 32 mEq/L   Glucose, Bld 86 70 - 99 mg/dL   BUN 17 6 - 23 mg/dL   Creatinine, Ser 1.00 0.40 - 1.20 mg/dL   Total Bilirubin 0.5 0.2 - 1.2 mg/dL   Alkaline Phosphatase 55 39 - 117 U/L   AST 15 0 - 37 U/L   ALT 14 0 - 35 U/L   Total Protein 6.7 6.0 - 8.3 g/dL   Albumin 4.4 3.5 - 5.2 g/dL   GFR 64.80 >60.00 mL/min    Comment: Calculated using the CKD-EPI Creatinine Equation (2021)   Calcium 9.7 8.4 - 10.5 mg/dL   Objective  Body mass index is 20.82 kg/m. Wt Readings from Last 3 Encounters:  08/27/20 142 lb 3.2 oz (64.5 kg)  10/04/18 142 lb (64.4 kg)  06/29/18 142 lb (64.4 kg)   Temp Readings from Last 3 Encounters:  08/27/20 98.4 F (36.9 C) (Oral)  10/04/18 (!) 97.2 F (36.2 C) (Tympanic)  12/13/17 98.7 F (37.1 C) (Oral)   BP Readings from Last 3 Encounters:  08/27/20 112/70  10/04/18 112/79  06/29/18 108/68   Pulse Readings from Last 3 Encounters:  08/27/20 80  10/04/18 68  06/29/18 82    Physical Exam Vitals and nursing note reviewed.  Constitutional:      Appearance: Normal appearance. She is well-developed and well-groomed.  HENT:     Head: Normocephalic and atraumatic.   Eyes:     Conjunctiva/sclera: Conjunctivae normal.     Pupils: Pupils are equal, round, and reactive to light.  Cardiovascular:     Rate and Rhythm: Normal rate and regular rhythm.     Heart sounds: Normal heart sounds. No murmur heard.   Pulmonary:     Effort: Pulmonary effort is normal.     Breath sounds: Normal breath sounds.  Skin:    General: Skin is warm and dry.  Neurological:     General: No focal deficit present.     Mental Status: She is alert and oriented to person, place, and time. Mental status is at baseline.     Gait: Gait normal.  Psychiatric:        Attention and Perception: Attention and perception normal.        Mood and Affect: Mood and affect normal.        Speech: Speech normal.        Behavior: Behavior normal. Behavior is cooperative.        Thought Content: Thought content normal.        Cognition and Memory: Cognition and memory normal.        Judgment: Judgment normal.     Assessment  Plan  Annual physical exam - Plan:  Reviewed labs 08/30/20 reviewed  Flu and Tdap UTD rec hep B vaccine pt to consider  1/2 at [facility] shingrix vaccine, 2nd shot today Had pfizer 3/3  Mammogram neg 10/25/17 neeed to get copy of pap from Dr. Corinna Capra. Pt also had my risk screening 10/21/17 and negative   Colonoscopy Dr. Allen Norris 10/04/2018 10 years   F/u dermatology 09/30/20 severely DN left abdomen Dr. Nicole Kindred   Pap Dr. Konrad Felix in [place] had 11/2019 per pt  rec healthy diet and exercise   Overactive bladder - Plan: Urine Culture negative  today   Mass of tongue -consider CT w/o contrast neck disc with radiology   Paresthesia of left lower extremity Consider EMG/NCS with neurology   Impacted cerumen of right ear due to impaction reduced hearing Consented and tolerated lavage and currette of right ear all wax removed    Provider: Dr. Olivia Mackie McLean-Scocuzza-Internal Medicine

## 2020-08-30 ENCOUNTER — Other Ambulatory Visit (INDEPENDENT_AMBULATORY_CARE_PROVIDER_SITE_OTHER): Payer: BC Managed Care – PPO

## 2020-08-30 ENCOUNTER — Other Ambulatory Visit: Payer: Self-pay

## 2020-08-30 DIAGNOSIS — N3281 Overactive bladder: Secondary | ICD-10-CM | POA: Diagnosis not present

## 2020-08-30 DIAGNOSIS — E538 Deficiency of other specified B group vitamins: Secondary | ICD-10-CM

## 2020-08-30 DIAGNOSIS — Z0000 Encounter for general adult medical examination without abnormal findings: Secondary | ICD-10-CM

## 2020-08-30 DIAGNOSIS — Z1389 Encounter for screening for other disorder: Secondary | ICD-10-CM

## 2020-08-30 DIAGNOSIS — Z1329 Encounter for screening for other suspected endocrine disorder: Secondary | ICD-10-CM

## 2020-08-30 DIAGNOSIS — E559 Vitamin D deficiency, unspecified: Secondary | ICD-10-CM | POA: Diagnosis not present

## 2020-08-30 LAB — CBC WITH DIFFERENTIAL/PLATELET
Basophils Absolute: 0 10*3/uL (ref 0.0–0.1)
Basophils Relative: 0.5 % (ref 0.0–3.0)
Eosinophils Absolute: 0.2 10*3/uL (ref 0.0–0.7)
Eosinophils Relative: 4.4 % (ref 0.0–5.0)
HCT: 43.4 % (ref 36.0–46.0)
Hemoglobin: 14.4 g/dL (ref 12.0–15.0)
Lymphocytes Relative: 43.9 % (ref 12.0–46.0)
Lymphs Abs: 1.8 10*3/uL (ref 0.7–4.0)
MCHC: 33.3 g/dL (ref 30.0–36.0)
MCV: 93.6 fl (ref 78.0–100.0)
Monocytes Absolute: 0.6 10*3/uL (ref 0.1–1.0)
Monocytes Relative: 13.8 % — ABNORMAL HIGH (ref 3.0–12.0)
Neutro Abs: 1.5 10*3/uL (ref 1.4–7.7)
Neutrophils Relative %: 37.4 % — ABNORMAL LOW (ref 43.0–77.0)
Platelets: 178 10*3/uL (ref 150.0–400.0)
RBC: 4.63 Mil/uL (ref 3.87–5.11)
RDW: 12.6 % (ref 11.5–15.5)
WBC: 4 10*3/uL (ref 4.0–10.5)

## 2020-08-30 LAB — LIPID PANEL
Cholesterol: 191 mg/dL (ref 0–200)
HDL: 66.9 mg/dL (ref >39.00)
LDL Cholesterol: 114 mg/dL — ABNORMAL HIGH (ref 0–99)
NonHDL: 124.36
Total CHOL/HDL Ratio: 3
Triglycerides: 53 mg/dL (ref 0.0–149.0)
VLDL: 10.6 mg/dL (ref 0.0–40.0)

## 2020-08-30 LAB — COMPREHENSIVE METABOLIC PANEL
ALT: 14 U/L (ref 0–35)
AST: 15 U/L (ref 0–37)
Albumin: 4.4 g/dL (ref 3.5–5.2)
Alkaline Phosphatase: 55 U/L (ref 39–117)
BUN: 17 mg/dL (ref 6–23)
CO2: 32 mEq/L (ref 19–32)
Calcium: 9.7 mg/dL (ref 8.4–10.5)
Chloride: 102 mEq/L (ref 96–112)
Creatinine, Ser: 1 mg/dL (ref 0.40–1.20)
GFR: 64.8 mL/min (ref >60.00)
Glucose, Bld: 86 mg/dL (ref 70–99)
Potassium: 4 mEq/L (ref 3.5–5.1)
Sodium: 142 mEq/L (ref 135–145)
Total Bilirubin: 0.5 mg/dL (ref 0.2–1.2)
Total Protein: 6.7 g/dL (ref 6.0–8.3)

## 2020-08-30 LAB — TSH: TSH: 2.88 u[IU]/mL (ref 0.35–4.50)

## 2020-08-30 LAB — VITAMIN B12: Vitamin B-12: 687 pg/mL (ref 211–911)

## 2020-08-30 LAB — VITAMIN D 25 HYDROXY (VIT D DEFICIENCY, FRACTURES): VITD: 71.32 ng/mL (ref 30.00–100.00)

## 2020-08-31 LAB — URINALYSIS, ROUTINE W REFLEX MICROSCOPIC
Bilirubin Urine: NEGATIVE
Glucose, UA: NEGATIVE
Hgb urine dipstick: NEGATIVE
Ketones, ur: NEGATIVE
Leukocytes,Ua: NEGATIVE
Nitrite: NEGATIVE
Protein, ur: NEGATIVE
Specific Gravity, Urine: 1.011 (ref 1.001–1.03)
pH: 7 (ref 5.0–8.0)

## 2020-08-31 LAB — URINE CULTURE
MICRO NUMBER:: 11303220
SPECIMEN QUALITY:: ADEQUATE

## 2020-09-03 ENCOUNTER — Telehealth: Payer: Self-pay | Admitting: Internal Medicine

## 2020-09-03 DIAGNOSIS — K148 Other diseases of tongue: Secondary | ICD-10-CM

## 2020-09-03 DIAGNOSIS — N3281 Overactive bladder: Secondary | ICD-10-CM | POA: Insufficient documentation

## 2020-09-03 HISTORY — DX: Other diseases of tongue: K14.8

## 2020-09-03 NOTE — Telephone Encounter (Signed)
Does she want me to order ct neck w/o dye for mass under tongue spoke with radiology?

## 2020-09-03 NOTE — Telephone Encounter (Signed)
Patient states she will see her dentist tomorrow and will discuss this and an oral surgeon she will then get back with our office if she wants the scan

## 2020-09-05 NOTE — Progress Notes (Signed)
Sent request for mammogram and pap to Dr. Gregor Hams office to be faxed to our office.  Kara Spencer,cma

## 2020-09-09 ENCOUNTER — Other Ambulatory Visit: Payer: Self-pay

## 2020-09-09 ENCOUNTER — Ambulatory Visit: Payer: BC Managed Care – PPO

## 2020-09-09 DIAGNOSIS — L570 Actinic keratosis: Secondary | ICD-10-CM

## 2020-09-09 MED ORDER — AMINOLEVULINIC ACID HCL 20 % EX SOLR
1.0000 "application " | Freq: Once | CUTANEOUS | Status: AC
  Administered 2020-09-09: 354 mg via TOPICAL

## 2020-09-09 NOTE — Progress Notes (Signed)
1. AK (actinic keratosis) Objective  face:   Photodynamic therapy - face Procedure discussed: discussed risks, benefits, side effects. and alternatives   Prep: site scrubbed/prepped with acetone   Location:  Face Number of lesions:  Multiple Type of treatment:  Blue light Aminolevulinic Acid (see MAR for details): Levulan Number of Levulan sticks used:  1 Incubation time (minutes):  60 Number of minutes under lamp:  16 Number of seconds under lamp:  40 Cooling:  Floor fan Outcome: patient tolerated procedure well with no complications   Post-procedure details: sunscreen applied and aftercare instructions given to patient    Aminolevulinic Acid HCl 20 % SOLR 354 mg - face

## 2020-09-09 NOTE — Patient Instructions (Signed)

## 2020-09-19 ENCOUNTER — Encounter: Payer: Self-pay | Admitting: Internal Medicine

## 2020-09-23 ENCOUNTER — Ambulatory Visit: Payer: BC Managed Care – PPO

## 2020-09-26 ENCOUNTER — Ambulatory Visit: Payer: BC Managed Care – PPO

## 2020-09-26 ENCOUNTER — Other Ambulatory Visit: Payer: Self-pay

## 2020-09-26 DIAGNOSIS — L570 Actinic keratosis: Secondary | ICD-10-CM

## 2020-09-26 MED ORDER — AMINOLEVULINIC ACID HCL 20 % EX SOLR
1.0000 "application " | Freq: Once | CUTANEOUS | Status: AC
  Administered 2020-09-26: 354 mg via TOPICAL

## 2020-09-26 NOTE — Patient Instructions (Signed)

## 2020-09-26 NOTE — Progress Notes (Signed)
Patient completed PDT therapy today.  1. AK (actinic keratosis) Chest and neck  Photodynamic therapy - Chest and neck Procedure discussed: discussed risks, benefits, side effects. and alternatives   Prep: site scrubbed/prepped with acetone   Location:  Chest and neck Number of lesions:  Multiple Type of treatment:  Blue light Aminolevulinic Acid (see MAR for details): Levulan Number of Levulan sticks used:  1 Incubation time (minutes):  120 Number of minutes under lamp:  16 Number of seconds under lamp:  40 Cooling:  Floor fan Outcome: patient tolerated procedure well with no complications   Post-procedure details: sunscreen applied    Aminolevulinic Acid HCl 20 % SOLR 354 mg - Chest and neck

## 2020-09-30 ENCOUNTER — Encounter: Payer: Self-pay | Admitting: Dermatology

## 2020-09-30 ENCOUNTER — Ambulatory Visit: Payer: BC Managed Care – PPO | Admitting: Dermatology

## 2020-09-30 ENCOUNTER — Other Ambulatory Visit: Payer: Self-pay

## 2020-09-30 DIAGNOSIS — D235 Other benign neoplasm of skin of trunk: Secondary | ICD-10-CM | POA: Diagnosis not present

## 2020-09-30 DIAGNOSIS — D239 Other benign neoplasm of skin, unspecified: Secondary | ICD-10-CM

## 2020-09-30 NOTE — Patient Instructions (Signed)

## 2020-09-30 NOTE — Progress Notes (Signed)
   Follow-Up Visit   Subjective  Kara Spencer is a 55 y.o. female who presents for the following: Dysplastic Nevus with Severe Atypia (Left upper abdomen, excise today.).   The following portions of the chart were reviewed this encounter and updated as appropriate:       Review of Systems:  No other skin or systemic complaints except as noted in HPI or Assessment and Plan.  Objective  Well appearing patient in no apparent distress; mood and affect are within normal limits.  A focused examination was performed including face, abdomen. Relevant physical exam findings are noted in the Assessment and Plan.  Objective  Left Upper Abdomen: Pink biopsy site.   Assessment & Plan  Dysplastic nevus Left Upper Abdomen  Skin excision - Left Upper Abdomen  Lesion length (cm):  0.7 Lesion width (cm):  0.4 Margin per side (cm):  0.2 Total excision diameter (cm):  1.1 Informed consent: discussed and consent obtained   Timeout: patient name, date of birth, surgical site, and procedure verified   Procedure prep:  Patient was prepped and draped in usual sterile fashion Prep type:  Povidone-iodine Anesthesia: the lesion was anesthetized in a standard fashion   Anesthetic:  1% lidocaine w/ epinephrine 1-100,000 buffered w/ 8.4% NaHCO3 (12cc) Instrument used: #15 blade   Hemostasis achieved with: pressure and electrodesiccation   Outcome: patient tolerated procedure well with no complications   Additional details:  9 o'clock tag   Skin repair - Left Upper Abdomen Complexity:  Intermediate Final length (cm):  3 Informed consent: discussed and consent obtained   Reason for type of repair: reduce tension to allow closure, reduce the risk of dehiscence, infection, and necrosis and enhance both functionality and cosmetic results   Undermining: edges undermined   Subcutaneous layers (deep stitches):  Suture size:  4-0 Suture type: Vicryl (polyglactin 910)   Stitches:  Buried vertical  mattress Fine/surface layer approximation (top stitches):  Suture size:  4-0 Suture type: nylon   Stitches: simple interrupted   Suture removal (days):  7 Hemostasis achieved with: suture Outcome: patient tolerated procedure well with no complications   Post-procedure details: sterile dressing applied and wound care instructions given   Dressing type: pressure dressing (mupirocin)    Specimen 1 - Surgical pathology Differential Diagnosis: Dysplastic Nevus with Severe Atypia Check Margins: Yes; 9 o'clock tag Pink biopsy site. [phone]   Return in about 1 week (around 10/07/2020) for suture removal.  I, Jamesetta Orleans, CMA, am acting as scribe for Brendolyn Patty, MD .  Documentation: I have reviewed the above documentation for accuracy and completeness, and I agree with the above.  Brendolyn Patty MD

## 2020-10-01 ENCOUNTER — Telehealth: Payer: Self-pay

## 2020-10-01 NOTE — Telephone Encounter (Signed)
Talked to patient and she is doing fine from surgery yesterday.  

## 2020-10-07 ENCOUNTER — Ambulatory Visit: Payer: BC Managed Care – PPO | Admitting: Dermatology

## 2020-10-07 ENCOUNTER — Ambulatory Visit: Payer: BC Managed Care – PPO

## 2020-10-08 ENCOUNTER — Other Ambulatory Visit: Payer: Self-pay

## 2020-10-08 ENCOUNTER — Ambulatory Visit (INDEPENDENT_AMBULATORY_CARE_PROVIDER_SITE_OTHER): Payer: BC Managed Care – PPO | Admitting: Dermatology

## 2020-10-08 DIAGNOSIS — D235 Other benign neoplasm of skin of trunk: Secondary | ICD-10-CM

## 2020-10-08 DIAGNOSIS — D239 Other benign neoplasm of skin, unspecified: Secondary | ICD-10-CM

## 2020-10-08 DIAGNOSIS — Z4802 Encounter for removal of sutures: Secondary | ICD-10-CM

## 2020-10-08 NOTE — Patient Instructions (Signed)

## 2020-10-08 NOTE — Progress Notes (Signed)
   Follow-Up Visit   Subjective  Marsella Suman is a 55 y.o. female who presents for the following: Post-op (Severe dysplastic nevus - margins free of the left upper abdomen.).  Healing well.   The following portions of the chart were reviewed this encounter and updated as appropriate:       Review of Systems:  No other skin or systemic complaints except as noted in HPI or Assessment and Plan.  Objective  Well appearing patient in no apparent distress; mood and affect are within normal limits.  A focused examination was performed including abd. Relevant physical exam findings are noted in the Assessment and Plan.  Objective  Left Upper Abdomen: Healing excision site.   Assessment & Plan  Dysplastic nevus Left Upper Abdomen  Patient presents for suture removal. The wound is well healed without signs of infection.  The sutures are removed. Wound care and activity instructions given.   Pathology- margins free  Return as scheduled.  Documentation: I have reviewed the above documentation for accuracy and completeness, and I agree with the above.  Brendolyn Patty MD

## 2020-10-10 ENCOUNTER — Other Ambulatory Visit: Payer: Self-pay

## 2020-10-10 ENCOUNTER — Ambulatory Visit: Payer: BC Managed Care – PPO

## 2020-10-10 DIAGNOSIS — L570 Actinic keratosis: Secondary | ICD-10-CM | POA: Diagnosis not present

## 2020-10-10 MED ORDER — AMINOLEVULINIC ACID HCL 20 % EX SOLR
1.0000 "application " | Freq: Once | CUTANEOUS | Status: AC
  Administered 2020-10-10: 354 mg via TOPICAL

## 2020-10-10 NOTE — Patient Instructions (Signed)
Levulan/PDT Treatment Common Side Effects  - Burning/stinging, which may be severe and last up to 24-72 hours after your treatment  - Redness, swelling and/or peeling which may last up to 4 weeks  - Scaling/crusting which may last up to 2 weeks  - Sun sensitivity (you MUST avoid sun exposure for 48-72 hours after treatment)  Care Instructions  - Okay to wash with soap and water and shampoo as normal  - If needed, you can do a cold compress (ex. Ice packs) for comfort  - If okay with your Primary Doctor, you may use analgesics such as Tylenol every 4-6 hours, not to exceed recommended dose  - You may apply Cerave Healing Ointment, Vaseline or Aquaphor  - If you have a lot of swelling you may take a Benadryl to help with this (this may cause drowsiness)  Sun Precautions  - Wear a wide brim hat for the next week if outside  - Wear a sunblock with zinc or titanium dioxide at least SPF 50 daily   We will recheck you in 10-12 weeks. If any problems, please call the office and ask to speak with a nurse. Levulan/PDT Treatment Common Side Effects  - Burning/stinging, which may be severe and last up to 24-72 hours after your treatment  - Redness, swelling and/or peeling which may last up to 4 weeks  - Scaling/crusting which may last up to 2 weeks  - Sun sensitivity (you MUST avoid sun exposure for 48-72 hours after treatment)  Care Instructions  - Okay to wash with soap and water and shampoo as normal  - If needed, you can do a cold compress (ex. Ice packs) for comfort  - If okay with your Primary Doctor, you may use analgesics such as Tylenol every 4-6 hours, not to exceed recommended dose  - You may apply Cerave Healing Ointment, Vaseline or Aquaphor  - If you have a lot of swelling you may take a Benadryl to help with this (this may cause drowsiness)  Sun Precautions  - Wear a wide brim hat for the next week if outside  - Wear a sunblock with zinc or titanium dioxide  at least SPF 50 daily   We will recheck you in 10-12 weeks. If any problems, please call the office and ask to speak with a nurse.

## 2020-10-10 NOTE — Progress Notes (Signed)
Patient completed PDT therapy today.  1. AK (actinic keratosis) Head - Anterior (Face)  Photodynamic therapy - Head - Anterior (Face) Procedure discussed: discussed risks, benefits, side effects. and alternatives   Prep: site scrubbed/prepped with acetone   Location:  Face Number of lesions:  Multiple Type of treatment:  Blue light Aminolevulinic Acid (see MAR for details): Levulan Number of Levulan sticks used:  1 Incubation time (minutes):  60 Number of minutes under lamp:  16 Number of seconds under lamp:  40 Cooling:  Floor fan Outcome: patient tolerated procedure well with no complications   Post-procedure details: sunscreen applied     

## 2021-02-18 ENCOUNTER — Encounter: Payer: Self-pay | Admitting: Dermatology

## 2021-02-18 ENCOUNTER — Other Ambulatory Visit: Payer: Self-pay

## 2021-02-18 ENCOUNTER — Ambulatory Visit: Payer: BC Managed Care – PPO | Admitting: Dermatology

## 2021-02-18 DIAGNOSIS — S90562A Insect bite (nonvenomous), left ankle, initial encounter: Secondary | ICD-10-CM | POA: Diagnosis not present

## 2021-02-18 DIAGNOSIS — D225 Melanocytic nevi of trunk: Secondary | ICD-10-CM

## 2021-02-18 DIAGNOSIS — S90862A Insect bite (nonvenomous), left foot, initial encounter: Secondary | ICD-10-CM

## 2021-02-18 DIAGNOSIS — D2271 Melanocytic nevi of right lower limb, including hip: Secondary | ICD-10-CM

## 2021-02-18 DIAGNOSIS — L814 Other melanin hyperpigmentation: Secondary | ICD-10-CM

## 2021-02-18 DIAGNOSIS — Z1283 Encounter for screening for malignant neoplasm of skin: Secondary | ICD-10-CM | POA: Diagnosis not present

## 2021-02-18 DIAGNOSIS — L719 Rosacea, unspecified: Secondary | ICD-10-CM

## 2021-02-18 DIAGNOSIS — Z85828 Personal history of other malignant neoplasm of skin: Secondary | ICD-10-CM | POA: Diagnosis not present

## 2021-02-18 DIAGNOSIS — D2371 Other benign neoplasm of skin of right lower limb, including hip: Secondary | ICD-10-CM

## 2021-02-18 DIAGNOSIS — L820 Inflamed seborrheic keratosis: Secondary | ICD-10-CM | POA: Diagnosis not present

## 2021-02-18 DIAGNOSIS — D229 Melanocytic nevi, unspecified: Secondary | ICD-10-CM

## 2021-02-18 DIAGNOSIS — D2361 Other benign neoplasm of skin of right upper limb, including shoulder: Secondary | ICD-10-CM

## 2021-02-18 DIAGNOSIS — L601 Onycholysis: Secondary | ICD-10-CM

## 2021-02-18 DIAGNOSIS — W57XXXA Bitten or stung by nonvenomous insect and other nonvenomous arthropods, initial encounter: Secondary | ICD-10-CM

## 2021-02-18 DIAGNOSIS — Z86018 Personal history of other benign neoplasm: Secondary | ICD-10-CM

## 2021-02-18 DIAGNOSIS — L578 Other skin changes due to chronic exposure to nonionizing radiation: Secondary | ICD-10-CM

## 2021-02-18 DIAGNOSIS — L821 Other seborrheic keratosis: Secondary | ICD-10-CM

## 2021-02-18 NOTE — Patient Instructions (Addendum)

## 2021-02-18 NOTE — Progress Notes (Signed)
Follow-Up Visit   Subjective  Kara Spencer is a 55 y.o. female who presents for the following: Follow-up (Patient here for 6 month follow-up and TBSE. She has a pink spot on her right nasal bridge that she noticed recently. She also would like her nails checked for possible fungus. She noticed her nails looked different after removing nail color strips yesterday. She has a history of BCCs and Dystplastic nevi.). Patient had PDT treatments to the face and chest/neck x 2 each. She didn't notice much redness or peeling after treatment.  The following portions of the chart were reviewed this encounter and updated as appropriate:       Review of Systems:  No other skin or systemic complaints except as noted in HPI or Assessment and Plan.  Objective  Well appearing patient in no apparent distress; mood and affect are within normal limits.  A full examination was performed including scalp, head, eyes, ears, nose, lips, neck, chest, axillae, abdomen, back, buttocks, bilateral upper extremities, bilateral lower extremities, hands, feet, fingers, toes, fingernails, and toenails. All findings within normal limits unless otherwise noted below.  Objective  R neck, see History: Well healed scar with no evidence of recurrence.   Objective  L upper abdomen, see History: Scar with no evidence of recurrence.   Objective  L ankle, L foot dorsum: Bright pink papules.  Objective  Right Upper Pretibia: 2.47mm medium brown macule  Right Lower Hip: 3.84mm medium brown macule  Right Lateral Breast: 6 x 26mm pink brown papule   Objective  Left Upper Arm: 5.73mm pink scaly macule, slightly waxy  Objective  fingernails: Distal onycholysis of the fingernails.  Images        Objective  face: Mild erythema of the cheeks and nose; telangiectasia on the right nasal root.   Assessment & Plan   Skin cancer screening performed today.  Actinic Damage - chronic, secondary to cumulative UV  radiation exposure/sun exposure over time - diffuse erythematous macules with underlying dyspigmentation - Recommend daily broad spectrum sunscreen SPF 30+ to sun-exposed areas, reapply every 2 hours as needed.  - Recommend staying in the shade or wearing long sleeves, sun glasses (UVA+UVB protection) and wide brim hats (4-inch brim around the entire circumference of the hat). - Call for new or changing lesions. -pt has PDT x 2 of face and chest with good result  Lentigines - Scattered tan macules - Due to sun exposure - Benign-appering, observe - Recommend daily broad spectrum sunscreen SPF 30+ to sun-exposed areas, reapply every 2 hours as needed. - Call for any changes  Melanocytic Nevi - Tan-brown and/or pink-flesh-colored symmetric macules and papules. - Benign appearing on exam today. Stable compared to previous photos. - Observation - Call clinic for new or changing moles - Recommend daily use of broad spectrum spf 30+ sunscreen to sun-exposed areas.   Seborrheic Keratoses - Stuck-on, waxy, tan-brown papules and/or plaques  - Benign-appearing - Discussed benign etiology and prognosis. - Observe - Call for any changes  Dermatofibroma - Firm pink/brown papulenodule with dimple sign of the R knee, R thigh, R forearm - Benign appearing - Call for any changes   History of basal cell carcinoma (BCC) R neck, see History  Clear. Observe for recurrence. Call clinic for new or changing lesions.  Recommend regular skin exams, daily broad-spectrum spf 30+ sunscreen use, and photoprotection.     History of dysplastic nevus L upper abdomen, see History  Clear. Observe for recurrence. Call clinic for new or  changing lesions.  Recommend regular skin exams, daily broad-spectrum spf 30+ sunscreen use, and photoprotection.     Bug bite without infection, initial encounter L ankle, L foot dorsum  Benign, observe.   Discussed topical steroid, patient defers today.  Nevus  (3) Right Lower Hip; Right Upper Pretibia; Right Lateral Breast  vs SK (R lateral breast)  Benign-appearing.  Observation.  Call clinic for new or changing moles.  Recommend daily use of broad spectrum spf 30+ sunscreen to sun-exposed areas.    Inflamed seborrheic keratosis Left Upper Arm  vs AK vs Early BCC  Recheck on f/u.  Prior to procedure, discussed risks of blister formation, small wound, skin dyspigmentation, or rare scar following cryotherapy.    Destruction of lesion - Left Upper Arm  Destruction method: cryotherapy   Informed consent: discussed and consent obtained   Lesion destroyed using liquid nitrogen: Yes   Region frozen until ice ball extended beyond lesion: Yes   Outcome: patient tolerated procedure well with no complications   Post-procedure details: wound care instructions given    Onycholysis fingernails  Possibly from reaction to nail color strips, since this is second time this has happened after their removal vrs tinea unguium  Recommend avoid using nail strips and let nails grow out. Photo taken today. If not improved in 6 wks, may consider treating for fungus. Imelda Pillow) pt will call.  Rosacea face  Mild, no treatment at this time Discussed BBL to clear telangiectasia Continue daily sunscreen  Rosacea is a chronic progressive skin condition usually affecting the face of adults, causing redness and/or acne bumps. It is treatable but not curable. It sometimes affects the eyes (ocular rosacea) as well. It may respond to topical and/or systemic medication and can flare with stress, sun exposure, alcohol, exercise and some foods.  Daily application of broad spectrum spf 30+ sunscreen to face is recommended to reduce flares.   Return in about 6 months (around 08/20/2021) for TBSE, recheck L upper arm.   Documentation: I have reviewed the above documentation for accuracy and completeness, and I agree with the above.  Brendolyn Patty MD

## 2021-05-13 LAB — HM PAP SMEAR: HM Pap smear: NORMAL

## 2021-05-15 ENCOUNTER — Other Ambulatory Visit: Payer: Self-pay | Admitting: Obstetrics and Gynecology

## 2021-05-15 DIAGNOSIS — R928 Other abnormal and inconclusive findings on diagnostic imaging of breast: Secondary | ICD-10-CM

## 2021-06-03 ENCOUNTER — Ambulatory Visit
Admission: RE | Admit: 2021-06-03 | Discharge: 2021-06-03 | Disposition: A | Discharge status: Home / Self Care | Payer: BC Managed Care – PPO | Source: Ambulatory Visit | Attending: Obstetrics and Gynecology | Admitting: Obstetrics and Gynecology

## 2021-06-03 ENCOUNTER — Other Ambulatory Visit: Payer: Self-pay

## 2021-06-03 DIAGNOSIS — R928 Other abnormal and inconclusive findings on diagnostic imaging of breast: Secondary | ICD-10-CM

## 2021-06-03 LAB — HM MAMMOGRAPHY

## 2021-08-12 ENCOUNTER — Other Ambulatory Visit: Payer: Self-pay

## 2021-08-12 ENCOUNTER — Other Ambulatory Visit: Payer: Self-pay | Admitting: Dermatology

## 2021-08-12 ENCOUNTER — Ambulatory Visit: Payer: BC Managed Care – PPO | Admitting: Dermatology

## 2021-08-12 DIAGNOSIS — Z85828 Personal history of other malignant neoplasm of skin: Secondary | ICD-10-CM | POA: Diagnosis not present

## 2021-08-12 DIAGNOSIS — L578 Other skin changes due to chronic exposure to nonionizing radiation: Secondary | ICD-10-CM

## 2021-08-12 DIAGNOSIS — L820 Inflamed seborrheic keratosis: Secondary | ICD-10-CM | POA: Diagnosis not present

## 2021-08-12 DIAGNOSIS — D1800 Hemangioma unspecified site: Secondary | ICD-10-CM

## 2021-08-12 DIAGNOSIS — D239 Other benign neoplasm of skin, unspecified: Secondary | ICD-10-CM

## 2021-08-12 DIAGNOSIS — D2271 Melanocytic nevi of right lower limb, including hip: Secondary | ICD-10-CM | POA: Diagnosis not present

## 2021-08-12 DIAGNOSIS — D225 Melanocytic nevi of trunk: Secondary | ICD-10-CM

## 2021-08-12 DIAGNOSIS — Z1283 Encounter for screening for malignant neoplasm of skin: Secondary | ICD-10-CM | POA: Diagnosis not present

## 2021-08-12 DIAGNOSIS — D492 Neoplasm of unspecified behavior of bone, soft tissue, and skin: Secondary | ICD-10-CM

## 2021-08-12 DIAGNOSIS — D2371 Other benign neoplasm of skin of right lower limb, including hip: Secondary | ICD-10-CM

## 2021-08-12 DIAGNOSIS — D2361 Other benign neoplasm of skin of right upper limb, including shoulder: Secondary | ICD-10-CM

## 2021-08-12 DIAGNOSIS — D229 Melanocytic nevi, unspecified: Secondary | ICD-10-CM

## 2021-08-12 DIAGNOSIS — Z86018 Personal history of other benign neoplasm: Secondary | ICD-10-CM

## 2021-08-12 DIAGNOSIS — D2372 Other benign neoplasm of skin of left lower limb, including hip: Secondary | ICD-10-CM

## 2021-08-12 DIAGNOSIS — B079 Viral wart, unspecified: Secondary | ICD-10-CM | POA: Diagnosis not present

## 2021-08-12 DIAGNOSIS — L814 Other melanin hyperpigmentation: Secondary | ICD-10-CM

## 2021-08-12 DIAGNOSIS — L821 Other seborrheic keratosis: Secondary | ICD-10-CM

## 2021-08-12 NOTE — Progress Notes (Deleted)
   Follow-Up Visit   Subjective  Kara Spencer is a 55 y.o. female who presents for the following: Total body skin exam (Hx of BCC R neck, R lower neck, L mid lower back,  hx of Dysplastic nevi) and check spot (R shoulder, just noticed).   The following portions of the chart were reviewed this encounter and updated as appropriate:       Review of Systems:  No other skin or systemic complaints except as noted in HPI or Assessment and Plan.  Objective  Well appearing patient in no apparent distress; mood and affect are within normal limits.  A full examination was performed including scalp, head, eyes, ears, nose, lips, neck, chest, axillae, abdomen, back, buttocks, bilateral upper extremities, bilateral lower extremities, hands, feet, fingers, toes, fingernails, and toenails. All findings within normal limits unless otherwise noted below.  R lower hip; R upper pretibia  R upper pretibia 2.8mm med brown macule  R lower hip 3.77mm med brown macule  R lat breast  R lat breast 6.0 x 4.37mm pink brown pap   Assessment & Plan   Lentigines - Scattered tan macules - Due to sun exposure - Benign-appearing, observe - Recommend daily broad spectrum sunscreen SPF 30+ to sun-exposed areas, reapply every 2 hours as needed. - Call for any changes  Seborrheic Keratoses - Stuck-on, waxy, tan-brown papules and/or plaques  - Benign-appearing - Discussed benign etiology and prognosis. - Observe - Call for any changes  Melanocytic Nevi - Tan-brown and/or pink-flesh-colored symmetric macules and papules - Benign appearing on exam today - Observation - Call clinic for new or changing moles - Recommend daily use of broad spectrum spf 30+ sunscreen to sun-exposed areas.   Hemangiomas - Red papules - Discussed benign nature - Observe - Call for any changes  Actinic Damage - Chronic condition, secondary to cumulative UV/sun exposure - diffuse scaly erythematous macules with  underlying dyspigmentation - Recommend daily broad spectrum sunscreen SPF 30+ to sun-exposed areas, reapply every 2 hours as needed.  - Staying in the shade or wearing long sleeves, sun glasses (UVA+UVB protection) and wide brim hats (4-inch brim around the entire circumference of the hat) are also recommended for sun protection.  - Call for new or changing lesions.  Skin cancer screening performed today.  Nevus (2) R lower hip; R upper pretibia  Seborrheic keratosis R lat breast  Vs Nevus  Benign-appearing.  Observation.  Call clinic for new or changing moles.  Recommend daily use of broad spectrum spf 30+ sunscreen to sun-exposed areas.   History of Basal Cell Carcinoma of the Skin - No evidence of recurrence today - Recommend regular full body skin exams - Recommend daily broad spectrum sunscreen SPF 30+ to sun-exposed areas, reapply every 2 hours as needed.  - Call if any new or changing lesions are noted between office visits  - R neck, R lower neck, L mid lower back  History of Dysplastic Nevi - No evidence of recurrence today - Recommend regular full body skin exams - Recommend daily broad spectrum sunscreen SPF 30+ to sun-exposed areas, reapply every 2 hours as needed.  - Call if any new or changing lesions are noted between office visits  - L upper abdomen, L post shoulder, L sternum, R spinal lower back No follow-ups on file.

## 2021-08-12 NOTE — Patient Instructions (Addendum)
If You Need Anything After Your Visit  If you have any questions or concerns for your doctor, please call our main line at [phone] and press option 4 to reach your doctor's medical assistant. If no one answers, please leave a voicemail as directed and we will return your call as soon as possible. Messages left after 4 pm will be answered the following business day.   You may also send us a message via MyChart. We typically respond to MyChart messages within 1-2 business days.  For prescription refills, please ask your pharmacy to contact our office. Our fax number is [phone].  If you have an urgent issue when the clinic is closed that cannot wait until the next business day, you can page your doctor at the number below.    Please note that while we do our best to be available for urgent issues outside of office hours, we are not available 24/7.   If you have an urgent issue and are unable to reach us, you may choose to seek medical care at your doctor's office, retail clinic, urgent care center, or emergency room.  If you have a medical emergency, please immediately call 911 or go to the emergency department.  Pager Numbers  - Dr. Kowalski: [phone]  - Dr. Moye: [phone]  - Dr. Stewart: [phone]  In the event of inclement weather, please call our main line at [phone] for an update on the status of any delays or closures.  Dermatology Medication Tips: Please keep the boxes that topical medications come in in order to help keep track of the instructions about where and how to use these. Pharmacies typically print the medication instructions only on the boxes and not directly on the medication tubes.   If your medication is too expensive, please contact our office at [phone] option 4 or send us a message through MyChart.   We are unable to tell what your co-pay for medications will be in advance as this is different depending on your insurance coverage.  However, we may be able to find a substitute medication at lower cost or fill out paperwork to get insurance to cover a needed medication.   If a prior authorization is required to get your medication covered by your insurance company, please allow us 1-2 business days to complete this process.  Drug prices often vary depending on where the prescription is filled and some pharmacies may offer cheaper prices.  The website www.goodrx.com contains coupons for medications through different pharmacies. The prices here do not account for what the cost may be with help from insurance (it may be cheaper with your insurance), but the website can give you the price if you did not use any insurance.  - You can print the associated coupon and take it with your prescription to the pharmacy.  - You may also stop by our office during regular business hours and pick up a GoodRx coupon card.  - If you need your prescription sent electronically to a different pharmacy, notify our office through [facility] [place] MyChart or by phone at [phone] option 4.     Si Usted Necesita Algo Despus de Su Visita  Tambin puede enviarnos un mensaje a travs de MyChart. Por lo general respondemos a los mensajes de MyChart en el transcurso de 1 a 2 das hbiles.  Para renovar recetas, por favor pida a su farmacia que se ponga en contacto con nuestra oficina. Nuestro nmero de fax es el [phone].  Si tiene   un asunto urgente cuando la clnica est cerrada y que no puede esperar hasta el siguiente da hbil, puede llamar/localizar a su doctor(a) al nmero que aparece a continuacin.   Por favor, tenga en cuenta que aunque hacemos todo lo posible para estar disponibles para asuntos urgentes fuera del horario de [place], no estamos disponibles las 24 horas del da, los 7 das de la [place].   Si tiene un problema urgente y no puede comunicarse con nosotros, puede optar por buscar atencin mdica  en el consultorio de su  doctor(a), en una clnica privada, en un centro de atencin urgente o en una sala de emergencias.  Si tiene Engineering geologist, por favor llame inmediatamente al 911 o vaya a la sala de emergencias.  Nmeros de bper  - Dr. Nehemiah Massed: [phone]  - Dra. Moye: [phone]  - Dra. Nicole Kindred: [phone]  En caso de inclemencias del [place], por favor llame a Johnsie Kindred principal al [phone] para una actualizacin sobre el [place] de cualquier retraso o cierre.  Consejos para la medicacin en dermatologa: Por favor, guarde las cajas en las que vienen los medicamentos de uso tpico para ayudarle a seguir las instrucciones sobre dnde y cmo usarlos. Las farmacias generalmente imprimen las instrucciones del medicamento slo en las cajas y no directamente en los tubos del [place].   Si su medicamento es muy caro, por favor, pngase en contacto con Zigmund Daniel llamando al [phone] y presione la opcin 4 o envenos un mensaje a travs de Pharmacist, community.   No podemos decirle cul ser su copago por los medicamentos por adelantado ya que esto es diferente dependiendo de la cobertura de su seguro. Sin embargo, es posible que podamos encontrar un medicamento sustituto a Electrical engineer un formulario para que el seguro cubra el medicamento que se considera necesario.   Si se requiere una autorizacin previa para que su compaa de seguros [place] su medicamento, por favor permtanos de 1 a 2 das hbiles para completar este proceso.  Los precios de los medicamentos varan con frecuencia dependiendo del Environmental consultant de dnde se surte la receta y alguna farmacias pueden ofrecer precios ms baratos.  El sitio web www.goodrx.com tiene cupones para medicamentos de Airline pilot. Los precios aqu no tienen en cuenta lo que podra costar con la ayuda del seguro (puede ser ms barato con su seguro), pero el sitio web puede darle el precio si no utiliz Research scientist (physical sciences).  - Puede imprimir el cupn  correspondiente y llevarlo con su receta a la farmacia.  - Tambin puede pasar por nuestra oficina durante el horario de atencin regular y Charity fundraiser una tarjeta de cupones de GoodRx.  - Si necesita que su receta se enve electrnicamente a una farmacia diferente, informe a nuestra oficina a travs de MyChart de [place] o por telfono llamando al [phone] y presione la opcin 4.  Wound Care Instructions  Cleanse wound gently with soap and water once a day then pat dry with clean gauze. Apply a thing coat of Petrolatum (petroleum jelly, "Vaseline") over the wound (unless you have an allergy to this). We recommend that you use a new, sterile tube of Vaseline. Do not pick or remove scabs. Do not remove the yellow or white "healing tissue" from the base of the wound.  Cover the wound with fresh, clean, nonstick gauze and secure with paper tape. You may use Band-Aids in place of gauze and tape if the would is small enough, but would recommend trimming much of the  tape off as there is often too much. Sometimes Band-Aids can irritate the skin.  You should call the office for your biopsy report after 1 week if you have not already been contacted.  If you experience any problems, such as abnormal amounts of bleeding, swelling, significant bruising, significant pain, or evidence of infection, please call the office immediately.  FOR ADULT SURGERY PATIENTS: If you need something for pain relief you may take 1 extra strength Tylenol (acetaminophen) AND 2 Ibuprofen (200mg  each) together every 4 hours as needed for pain. (do not take these if you are allergic to them or if you have a reason you should not take them.) Typically, you may only need pain medication for 1 to 3 days.    Cryotherapy Aftercare  Wash gently with soap and water everyday.   Apply Vaseline and Band-Aid daily until healed.

## 2021-08-12 NOTE — Progress Notes (Signed)
Follow-Up Visit   Subjective  Kara Spencer is a 55 y.o. female who presents for the following: Total body skin exam (Hx of BCC R neck, R lower neck, L mid lower back,  hx of Dysplastic nevi) and check spot (R shoulder, just noticed). Is scaly.  Also has small bumps on legs she just noticed.   The following portions of the chart were reviewed this encounter and updated as appropriate:       Review of Systems:  No other skin or systemic complaints except as noted in HPI or Assessment and Plan.  Objective  Well appearing patient in no apparent distress; mood and affect are within normal limits.  A full examination was performed including scalp, head, eyes, ears, nose, lips, neck, chest, axillae, abdomen, back, buttocks, bilateral upper extremities, bilateral lower extremities, hands, feet, fingers, toes, fingernails, and toenails. All findings within normal limits unless otherwise noted below.  R lat buttock; R lower hip/ upper thigh; R upper pretibia  R upper pretibia 2.39mm med brown macule  R lower hip/ upper thigh 3.1mm med brown macule  R lat buttock 7.0 x 3.40mm med brown macule appears as 2 adjacent  R lat breast  R lat breast 6.0 x 4.29mm pink brown pap  R upper clavicle x 2 (2) Erythematous keratotic or waxy stuck-on papule  L spinal upper back 4.13mm brown macule with slight irregular pigment     Right Forearm, L thigh, R upper knee Firm pink/brown papulenodule with dimple sign.   R leg x 22, L leg x 14 (36) Pink flesh flat paps   Assessment & Plan   Lentigines - Scattered tan macules - Due to sun exposure - Benign-appearing, observe - Recommend daily broad spectrum sunscreen SPF 30+ to sun-exposed areas, reapply every 2 hours as needed. - Call for any changes  Seborrheic Keratoses - Stuck-on, waxy, tan-brown papules and/or plaques  - Benign-appearing - Discussed benign etiology and prognosis. - Observe - Call for any changes  Melanocytic  Nevi - Tan-brown and/or pink-flesh-colored symmetric macules and papules - Benign appearing on exam today - Observation - Call clinic for new or changing moles - Recommend daily use of broad spectrum spf 30+ sunscreen to sun-exposed areas.   Hemangiomas - Red papules - Discussed benign nature - Observe - Call for any changes  Actinic Damage - Chronic condition, secondary to cumulative UV/sun exposure - diffuse scaly erythematous macules with underlying dyspigmentation - Recommend daily broad spectrum sunscreen SPF 30+ to sun-exposed areas, reapply every 2 hours as needed.  - Staying in the shade or wearing long sleeves, sun glasses (UVA+UVB protection) and wide brim hats (4-inch brim around the entire circumference of the hat) are also recommended for sun protection.  - Call for new or changing lesions.  Skin cancer screening performed today.  History of Basal Cell Carcinoma of the Skin - No evidence of recurrence today - Recommend regular full body skin exams - Recommend daily broad spectrum sunscreen SPF 30+ to sun-exposed areas, reapply every 2 hours as needed.  - Call if any new or changing lesions are noted between office visits - L mid lower back, R lower neck, R neck  History of Dysplastic Nevi - No evidence of recurrence today - Recommend regular full body skin exams - Recommend daily broad spectrum sunscreen SPF 30+ to sun-exposed areas, reapply every 2 hours as needed.  - Call if any new or changing lesions are noted between office visits  - L upper abdomen, L  post shoulder, L sternum, R spinal lower back  Nevus (3) R lat buttock; R lower hip/ upper thigh; R upper pretibia  Benign-appearing.  Stable. Observation.  Call clinic for new or changing moles.  Recommend daily use of broad spectrum spf 30+ sunscreen to sun-exposed areas.    Seborrheic keratosis R lat breast  Vs Nevus  Benign-appearing.  Observation.  Call clinic for new or changing moles.  Recommend  daily use of broad spectrum spf 30+ sunscreen to sun-exposed areas.    Inflamed seborrheic keratosis R upper clavicle x 2  Destruction of lesion - R upper clavicle x 2  Destruction method: cryotherapy   Informed consent: discussed and consent obtained   Lesion destroyed using liquid nitrogen: Yes   Region frozen until ice ball extended beyond lesion: Yes   Outcome: patient tolerated procedure well with no complications   Post-procedure details: wound care instructions given   Additional details:  Prior to procedure, discussed risks of blister formation, small wound, skin dyspigmentation, or rare scar following cryotherapy. Recommend Vaseline ointment to treated areas while healing.   Neoplasm of skin L spinal upper back  Epidermal / dermal shaving  Lesion diameter (cm):  0.6 Informed consent: discussed and consent obtained   Patient was prepped and draped in usual sterile fashion: area prepped with alcohol. Anesthesia: the lesion was anesthetized in a standard fashion   Anesthetic:  1% lidocaine w/ epinephrine 1-100,000 buffered w/ 8.4% NaHCO3 Instrument used: flexible razor blade   Hemostasis achieved with: pressure, aluminum chloride and electrodesiccation   Outcome: patient tolerated procedure well   Post-procedure details: wound care instructions given   Post-procedure details comment:  Ointment and small bandage applied  Specimen 1 - Surgical pathology Differential Diagnosis: D48.5 Nevus vs Dysplastic Nevus  Check Margins: yes 4.19mm brown macule with slight irregular pigment  Dermatofibroma Right Forearm, L thigh, R upper knee  Benign, observe.    Viral warts, unspecified type (36) R leg x 22, L leg x 14  [place] Discussed viral etiology and risk of spread.  Discussed multiple treatments may be required to clear warts.  Discussed possible post-treatment dyspigmentation and risk of recurrence.  Avoid shaving with razor over warts as this can cause  spread  Destruction of lesion - R leg x 22, L leg x 14  Destruction method: cryotherapy   Informed consent: discussed and consent obtained   Lesion destroyed using liquid nitrogen: Yes   Region frozen until ice ball extended beyond lesion: Yes   Outcome: patient tolerated procedure well with no complications   Post-procedure details: wound care instructions given   Additional details:  Prior to procedure, discussed risks of blister formation, small wound, skin dyspigmentation, or rare scar following cryotherapy. Recommend Vaseline ointment to treated areas while healing.   Return in about 6 months (around 02/09/2022) for TBSE, Hx of BCC, Hx of Dysplastic nevi.  I, Othelia Pulling, RMA, am acting as scribe for Brendolyn Patty, MD .  Documentation: I have reviewed the above documentation for accuracy and completeness, and I agree with the above.  Brendolyn Patty MD

## 2021-08-18 ENCOUNTER — Telehealth: Payer: Self-pay

## 2021-08-18 NOTE — Telephone Encounter (Signed)
Left pt msg to call for bx results/sh 

## 2021-08-18 NOTE — Telephone Encounter (Signed)
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 to severely atypical mole- needs repeat shave removal to ensure all is removed.  Schedule for after biopsy site has healed ~4-6 weeks - please call patient

## 2021-08-19 ENCOUNTER — Telehealth: Payer: Self-pay

## 2021-08-19 NOTE — Telephone Encounter (Signed)
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 to severely atypical mole- needs repeat shave removal to ensure all is removed.  Schedule for after biopsy site has healed ~4-6 weeks - please call patient

## 2021-08-19 NOTE — Telephone Encounter (Signed)
Patient advised of BX results and scheduled for shave removal. aw

## 2021-08-28 ENCOUNTER — Ambulatory Visit (INDEPENDENT_AMBULATORY_CARE_PROVIDER_SITE_OTHER): Payer: BC Managed Care – PPO | Admitting: Internal Medicine

## 2021-08-28 ENCOUNTER — Encounter: Payer: Self-pay | Admitting: Internal Medicine

## 2021-08-28 ENCOUNTER — Other Ambulatory Visit: Payer: Self-pay

## 2021-08-28 ENCOUNTER — Ambulatory Visit (INDEPENDENT_AMBULATORY_CARE_PROVIDER_SITE_OTHER): Payer: BC Managed Care – PPO

## 2021-08-28 VITALS — BP 114/74 | HR 75 | Temp 97.7°F | Ht 69.21 in | Wt 146.2 lb

## 2021-08-28 DIAGNOSIS — Z23 Encounter for immunization: Secondary | ICD-10-CM

## 2021-08-28 DIAGNOSIS — M79672 Pain in left foot: Secondary | ICD-10-CM

## 2021-08-28 DIAGNOSIS — Z13220 Encounter for screening for lipoid disorders: Secondary | ICD-10-CM

## 2021-08-28 DIAGNOSIS — E538 Deficiency of other specified B group vitamins: Secondary | ICD-10-CM

## 2021-08-28 DIAGNOSIS — Z1329 Encounter for screening for other suspected endocrine disorder: Secondary | ICD-10-CM

## 2021-08-28 DIAGNOSIS — Z1389 Encounter for screening for other disorder: Secondary | ICD-10-CM

## 2021-08-28 DIAGNOSIS — Z0000 Encounter for general adult medical examination without abnormal findings: Secondary | ICD-10-CM

## 2021-08-28 DIAGNOSIS — E559 Vitamin D deficiency, unspecified: Secondary | ICD-10-CM

## 2021-08-28 NOTE — Patient Instructions (Signed)
Pneumococcal Conjugate Vaccine (Prevnar 13) Suspension for Injection What is this medication? PNEUMOCOCCAL VACCINE (NEU mo KOK al vak SEEN) is a vaccine used to prevent pneumococcus bacterial infections. These bacteria can cause serious infections like pneumonia, meningitis, and blood infections. This vaccine will lower your chance of getting pneumonia. If you do get pneumonia, it can make your symptoms milder and your illness shorter. This vaccine will not treat an infection and will not cause infection. This vaccine is recommended for infants and young children, adults with certain medical conditions, and adults 40 years or older. This medicine may be used for other purposes; ask your health care provider or pharmacist if you have questions. COMMON BRAND NAME(S): Prevnar, Prevnar 13 What should I tell my care team before I take this medication? They need to know if you have any of these conditions: bleeding problems fever immune system problems an unusual or allergic reaction to pneumococcal vaccine, diphtheria toxoid, other vaccines, latex, other medicines, foods, dyes, or preservatives pregnant or trying to get pregnant breast-feeding How should I use this medication? This vaccine is for injection into a muscle. It is given by a health care professional. A copy of Vaccine Information Statements will be given before each vaccination. Read this sheet carefully each time. The sheet may change frequently. Talk to your pediatrician regarding the use of this medicine in children. While this drug may be prescribed for children as young as 28 weeks old for selected conditions, precautions do apply. Overdosage: If you think you have taken too much of this medicine contact a poison control center or emergency room at once. NOTE: This medicine is only for you. Do not share this medicine with others. What if I miss a dose? It is important not to miss your dose. Call your doctor or health care professional  if you are unable to keep an appointment. What may interact with this medication? medicines for cancer chemotherapy medicines that suppress your immune function steroid medicines like prednisone or cortisone This list may not describe all possible interactions. Give your health care provider a list of all the medicines, herbs, non-prescription drugs, or dietary supplements you use. Also tell them if you smoke, drink alcohol, or use illegal drugs. Some items may interact with your medicine. What should I watch for while using this medication? Mild fever and pain should go away in 3 days or less. Report any unusual symptoms to your doctor or health care professional. What side effects may I notice from receiving this medication? Side effects that you should report to your doctor or health care professional as soon as possible: allergic reactions like skin rash, itching or hives, swelling of the face, lips, or tongue breathing problems confused fast or irregular heartbeat fever over 102 degrees F seizures unusual bleeding or bruising unusual muscle weakness Side effects that usually do not require medical attention (report to your doctor or health care professional if they continue or are bothersome): aches and pains diarrhea fever of 102 degrees F or less headache irritable loss of appetite pain, tender at site where injected trouble sleeping This list may not describe all possible side effects. Call your doctor for medical advice about side effects. You may report side effects to FDA at 1-800-FDA-1088. Where should I keep my medication? This does not apply. This vaccine is given in a clinic, pharmacy, doctor's office, or other health care setting and will not be stored at home. NOTE: This sheet is a summary. It may not cover all possible  information. If you have questions about this medicine, talk to your doctor, pharmacist, or health care provider.  2022 Elsevier/Gold Standard  (2014-06-14 00:00:00)

## 2021-08-28 NOTE — Progress Notes (Signed)
Chief Complaint  Patient presents with   Annual Exam   Gynecologic Exam   Annual  1. C/o left foot stabbing pain 11 years go dropped can on foot not sure which foot, pain intermittent  2. Abnormal mammogram 05/13/21 and 05/2021 dx mammogram normal f/u in 1 year    Review of Systems  Constitutional:  Negative for weight loss.  HENT:  Negative for hearing loss.   Eyes:  Negative for blurred vision.  Respiratory:  Negative for shortness of breath.   Cardiovascular:  Negative for chest pain.  Gastrointestinal:  Negative for abdominal pain and blood in stool.  Genitourinary:  Negative for dysuria.  Musculoskeletal:  Positive for joint pain. Negative for falls.  Skin:  Negative for rash.  Neurological:  Negative for headaches.  Psychiatric/Behavioral:  Negative for depression.   Past Medical History:  Diagnosis Date   Actinic keratosis    Basal cell carcinoma 02/2010   Left mid lower back   Basal cell carcinoma 2016   Right lower neck   Basal cell carcinoma 09/25/2019   Right neck, [facility]   Dysplastic nevi    s/p removal 07/30/17   Dysplastic nevi 09/18/2019   left upper abdomen/excision   Dysplastic nevi 08/08/2019   left post shoulder/mod   Dysplastic nevi 10/04/2017   left sternum/excision   Dysplastic nevi 02/2010   R spinal lower back   Dysplastic nevus 08/05/2020   L upper abdomen, severe   Dysplastic nevus 08/12/2021   L spinal upper back, moderate to severe atypia, needs shave removal   Gluten intolerance    Wheat, caicin   History of chicken pox    Past Surgical History:  Procedure Laterality Date   COLONOSCOPY WITH PROPOFOL N/A 10/04/2018   Procedure: COLONOSCOPY WITH PROPOFOL;  Surgeon: Lucilla Lame, MD;  Location: [facility] ENDOSCOPY;  Service: Endoscopy;  Laterality: N/A;   TONSILLECTOMY AND ADENOIDECTOMY  1973   Family History  Problem Relation Age of Onset   Lung cancer Mother        died 2018/04/09    Stroke Mother    Cancer Mother        lung cancer smoker     Lung cancer Father    Cancer Father        lung cancer - smoker   Early death Sister        automobile accident   Thyroid disease Maternal Grandmother    Heart disease Paternal Grandfather    Cancer Paternal Aunt        ovarian cancer   Social History   Socioeconomic History   Marital status: Married    Spouse name: Not on file   Number of children: 1   Years of education: 18   Highest education level: Not on file  Occupational History   Occupation: Market researcher: [employer]  Tobacco Use   Smoking status: Never   Smokeless tobacco: Never  Substance and Sexual Activity   Alcohol use: Yes    Comment: 2-3 times a month   Drug use: No   Sexual activity: Yes  Other Topics Concern   Not on file  Social History Narrative   Ilhan grew up in [place]. She attended [employer] and obtained her Paediatric nurse in Communication Disorders. She then obtained her Masters in Sport and exercise psychologist from [employer]. She currently lives in [place] with her husband and daughter (as of 07/2017 daughter is 76.5 y.o)   Social Determinants  of Health   Financial Resource Strain: Not on file  Food Insecurity: Not on file  Transportation Needs: Not on file  Physical Activity: Not on file  Stress: Not on file  Social Connections: Not on file  Intimate Partner Violence: Not on file   Current Meds  Medication Sig   Ascorbic Acid (VITAMIN C) 1000 MG tablet Take 1,000 mg by mouth daily.    ASTAXANTHIN PO Take 12 mg by mouth daily.   Black Cohosh 540 MG CAPS Take 540 mg by mouth in the morning, at noon, and at bedtime.   Calcium-Magnesium-Vitamin D (CALCIUM MAGNESIUM PO) Take by mouth daily.   DIGESTIVE ENZYMES PO Take 1 tablet by mouth 3 (three) times daily.   medroxyPROGESTERone (PROVERA) 10 MG tablet medroxyprogesterone 10 mg tablet  TAKE 1 TABLET EVERY DAY BY ORAL ROUTE AT BEDTIME FOR 10 DAYS.   Multiple Vitamin (MULTIVITAMIN) capsule  Take 1 capsule by mouth 2 (two) times daily.    NON FORMULARY phophatidyl choline 385 mg x 1    NON FORMULARY Calcium/magnesium 500mg /250 x 2   NON FORMULARY biocell Collagen 1000 mg qd  Per pt all supplements per Dr. Carney Living   Nutritional Supplements (PYCNOGENOL) 30 MG CAPS Take 1 capsule by mouth daily.   Omega-3 Fatty Acids (OMEGA 3 PO) Take 800 mg by mouth. Liquid form. At bedtime   Probiotic Product (PROBIOTIC DAILY PO) Take 1 tablet by mouth daily.   VITAMIN D, CHOLECALCIFEROL, PO Take 50 mcg by mouth daily in the afternoon.    Allergies  Allergen Reactions   Other Swelling    MSG>swelling  Other allergies casein joint digestive, sesame ears red and jt pain, hemp/tapioca gluten digestive, amaruth, sorghum, buckwheat>gluten digestive.  Per pt this was done via blood tests with Dr. Carney Living    Casein Diarrhea   Sesame Oil    Wheat Bran     Joint pain, digestive Grains she can't tolerate: Amaranth, sorghum, buckwheat, tapioca   No results found for this or any previous visit (from the past 2160 hour(s)). Objective  Body mass index is 21.46 kg/m. Wt Readings from Last 3 Encounters:  08/28/21 146 lb 3.2 oz (66.3 kg)  08/27/20 142 lb 3.2 oz (64.5 kg)  10/04/18 142 lb (64.4 kg)   Temp Readings from Last 3 Encounters:  08/28/21 97.7 F (36.5 C) (Temporal)  08/27/20 98.4 F (36.9 C) (Oral)  10/04/18 (!) 97.2 F (36.2 C) (Tympanic)   BP Readings from Last 3 Encounters:  08/28/21 114/74  08/27/20 112/70  10/04/18 112/79   Pulse Readings from Last 3 Encounters:  08/28/21 75  08/27/20 80  10/04/18 68    Physical Exam Vitals and nursing note reviewed.  Constitutional:      Appearance: Normal appearance. She is well-developed and well-groomed.  HENT:     Head: Normocephalic and atraumatic.  Eyes:     Conjunctiva/sclera: Conjunctivae normal.     Pupils: Pupils are equal, round, and reactive to light.  Cardiovascular:     Rate and Rhythm: Normal rate and regular rhythm.      Heart sounds: Normal heart sounds. No murmur heard. Pulmonary:     Effort: Pulmonary effort is normal.     Breath sounds: Normal breath sounds.  Abdominal:     General: Abdomen is flat. Bowel sounds are normal.     Tenderness: There is no abdominal tenderness.  Musculoskeletal:        General: No tenderness.  Skin:    General: Skin is warm  and dry.  Neurological:     General: No focal deficit present.     Mental Status: She is alert and oriented to person, place, and time. Mental status is at baseline.     Cranial Nerves: Cranial nerves 2-12 are intact.     Gait: Gait is intact.  Psychiatric:        Attention and Perception: Attention and perception normal.        Mood and Affect: Mood and affect normal.        Speech: Speech normal.        Behavior: Behavior normal. Behavior is cooperative.        Thought Content: Thought content normal.        Cognition and Memory: Cognition and memory normal.        Judgment: Judgment normal.    Assessment  Plan  Annual physical exam - Plan: Comprehensive metabolic panel, Lipid panel, CBC with Differential/Platelet, TSH, Urinalysis, Routine w reflex microscopic, Vitamin D (25 hydroxy), Vitamin B12 See below  Flu and Tdap UTD Prevnar 13 given today  rec hep B vaccine pt to consider  [facility]/2 [facility]ingrix vaccine, 2nd shot today Had pfizer  5/5 1 01/24/21 [id number] not logged    Mammogram neg 10/25/17 neeed to get copy of pap from Dr. Corinna Capra. Pt also had my risk screening 10/21/17 and negative    Colonoscopy Dr. Allen Norris 10/04/2018 10 years    F/u dermatology 09/30/20 severely DN left abdomen Dr. Nicole Kindred  08/12/21 left back moderate to severe exc in 4-6 weeks  Q6 months  Pap Dr. Konrad Felix in [place] had 11/2019 per pt  rec healthy diet and exercise   Left foot pain - Plan: DG Foot Complete Left    Provider: Dr. Olivia Mackie McLean-Scocuzza-Internal Medicine

## 2021-09-11 ENCOUNTER — Other Ambulatory Visit: Payer: BC Managed Care – PPO

## 2021-09-26 ENCOUNTER — Telehealth: Payer: Self-pay | Admitting: Internal Medicine

## 2021-09-26 NOTE — Telephone Encounter (Signed)
Faxed signed ROI to [facility] requesting patient's mammogram and pap results. Received confirmation of fax going through.   ROI sent to scan.

## 2021-09-29 ENCOUNTER — Encounter: Payer: Self-pay | Admitting: Dermatology

## 2021-09-29 ENCOUNTER — Ambulatory Visit: Payer: BC Managed Care – PPO | Admitting: Dermatology

## 2021-09-29 ENCOUNTER — Other Ambulatory Visit: Payer: Self-pay

## 2021-09-29 DIAGNOSIS — L578 Other skin changes due to chronic exposure to nonionizing radiation: Secondary | ICD-10-CM | POA: Diagnosis not present

## 2021-09-29 DIAGNOSIS — B078 Other viral warts: Secondary | ICD-10-CM | POA: Diagnosis not present

## 2021-09-29 DIAGNOSIS — D235 Other benign neoplasm of skin of trunk: Secondary | ICD-10-CM | POA: Diagnosis not present

## 2021-09-29 DIAGNOSIS — D239 Other benign neoplasm of skin, unspecified: Secondary | ICD-10-CM

## 2021-09-29 NOTE — Patient Instructions (Addendum)
Cryotherapy Aftercare  Wash gently with soap and water everyday.   Apply Vaseline and Band-Aid daily until healed.    Wound Care Instructions  Cleanse wound gently with soap and water once a day then pat dry with clean gauze. Apply a thing coat of Petrolatum (petroleum jelly, "Vaseline") over the wound (unless you have an allergy to this). We recommend that you use a new, sterile tube of Vaseline. Do not pick or remove scabs. Do not remove the yellow or white "healing tissue" from the base of the wound.  Cover the wound with fresh, clean, nonstick gauze and secure with paper tape. You may use Band-Aids in place of gauze and tape if the would is small enough, but would recommend trimming much of the tape off as there is often too much. Sometimes Band-Aids can irritate the skin.  You should call the office for your biopsy report after 1 week if you have not already been contacted.  If you experience any problems, such as abnormal amounts of bleeding, swelling, significant bruising, significant pain, or evidence of infection, please call the office immediately.  FOR ADULT SURGERY PATIENTS: If you need something for pain relief you may take 1 extra strength Tylenol (acetaminophen) AND 2 Ibuprofen (200mg each) together every 4 hours as needed for pain. (do not take these if you are allergic to them or if you have a reason you should not take them.) Typically, you may only need pain medication for 1 to 3 days.        If You Need Anything After Your Visit  If you have any questions or concerns for your doctor, please call our main line at [phone] and press option 4 to reach your doctor's medical assistant. If no one answers, please leave a voicemail as directed and we will return your call as soon as possible. Messages left after 4 pm will be answered the following business day.   You may also send us a message via MyChart. We typically respond to MyChart messages within 1-2 business  days.  For prescription refills, please ask your pharmacy to contact our office. Our fax number is [phone].  If you have an urgent issue when the clinic is closed that cannot wait until the next business day, you can page your doctor at the number below.    Please note that while we do our best to be available for urgent issues outside of office hours, we are not available 24/7.   If you have an urgent issue and are unable to reach us, you may choose to seek medical care at your doctor's office, retail clinic, urgent care center, or emergency room.  If you have a medical emergency, please immediately call 911 or go to the emergency department.  Pager Numbers  - Dr. Kowalski: [phone]  - Dr. Moye: [phone]  - Dr. Stewart: [phone]  In the event of inclement weather, please call our main line at [phone] for an update on the status of any delays or closures.  Dermatology Medication Tips: Please keep the boxes that topical medications come in in order to help keep track of the instructions about where and how to use these. Pharmacies typically print the medication instructions only on the boxes and not directly on the medication tubes.   If your medication is too expensive, please contact our office at [phone] option 4 or send us a message through MyChart.   We are unable to tell what your co-pay for medications will be   in advance as this is different depending on your insurance coverage. However, we may be able to find a substitute medication at lower cost or fill out paperwork to get insurance to cover a needed medication.   If a prior authorization is required to get your medication covered by your insurance company, please allow us 1-2 business days to complete this process.  Drug prices often vary depending on where the prescription is filled and some pharmacies may offer cheaper prices.  The website www.goodrx.com contains coupons for medications through  different pharmacies. The prices here do not account for what the cost may be with help from insurance (it may be cheaper with your insurance), but the website can give you the price if you did not use any insurance.  - You can print the associated coupon and take it with your prescription to the pharmacy.  - You may also stop by our office during regular business hours and pick up a GoodRx coupon card.  - If you need your prescription sent electronically to a different pharmacy, notify our office through [facility] MyChart or by phone at [phone] option 4.     Si Usted Necesita Algo Despus de Su Visita  Tambin puede enviarnos un mensaje a travs de MyChart. Por lo general respondemos a los mensajes de MyChart en el transcurso de 1 a 2 das hbiles.  Para renovar recetas, por favor pida a su farmacia que se ponga en contacto con nuestra oficina. Nuestro nmero de fax es el [phone].  Si tiene un asunto urgente cuando la clnica est cerrada y que no puede esperar hasta el siguiente da hbil, puede llamar/localizar a su doctor(a) al nmero que aparece a continuacin.   Por favor, tenga en cuenta que aunque hacemos todo lo posible para estar disponibles para asuntos urgentes fuera del horario de oficina, no estamos disponibles las 24 horas del da, los 7 das de la semana.   Si tiene un problema urgente y no puede comunicarse con nosotros, puede optar por buscar atencin mdica  en el consultorio de su doctor(a), en una clnica privada, en un centro de atencin urgente o en una sala de emergencias.  Si tiene una emergencia mdica, por favor llame inmediatamente al 911 o vaya a la sala de emergencias.  Nmeros de bper  - Dr. Kowalski: [phone]  - Dra. Moye: [phone]  - Dra. Stewart: [phone]  En caso de inclemencias del tiempo, por favor llame a nuestra lnea principal al [phone] para una actualizacin sobre el estado de cualquier retraso o cierre.  Consejos  para la medicacin en dermatologa: Por favor, guarde las cajas en las que vienen los medicamentos de uso tpico para ayudarle a seguir las instrucciones sobre dnde y cmo usarlos. Las farmacias generalmente imprimen las instrucciones del medicamento slo en las cajas y no directamente en los tubos del medicamento.   Si su medicamento es muy caro, por favor, pngase en contacto con nuestra oficina llamando al [phone] y presione la opcin 4 o envenos un mensaje a travs de MyChart.   No podemos decirle cul ser su copago por los medicamentos por adelantado ya que esto es diferente dependiendo de la cobertura de su seguro. Sin embargo, es posible que podamos encontrar un medicamento sustituto a menor costo o llenar un formulario para que el seguro cubra el medicamento que se considera necesario.   Si se requiere una autorizacin previa para que su compaa de seguros cubra su medicamento, por favor permtanos de 1 a   2 das hbiles para completar este proceso.  Los precios de los medicamentos varan con frecuencia dependiendo del lugar de dnde se surte la receta y alguna farmacias pueden ofrecer precios ms baratos.  El sitio web www.goodrx.com tiene cupones para medicamentos de diferentes farmacias. Los precios aqu no tienen en cuenta lo que podra costar con la ayuda del seguro (puede ser ms barato con su seguro), pero el sitio web puede darle el precio si no utiliz ningn seguro.  - Puede imprimir el cupn correspondiente y llevarlo con su receta a la farmacia.  - Tambin puede pasar por nuestra oficina durante el horario de atencin regular y recoger una tarjeta de cupones de GoodRx.  - Si necesita que su receta se enve electrnicamente a una farmacia diferente, informe a nuestra oficina a travs de MyChart de [facility] o por telfono llamando al [phone] y presione la opcin 4.  

## 2021-09-29 NOTE — Progress Notes (Signed)
° °  Follow-Up Visit   Subjective  Kara Spencer is a 56 y.o. female who presents for the following: Dysplastic Nevus (Moderate to severe atypia, biopsy proven. Shave removal today. ) and Flat warts (Bilateral legs, improving with cryotherapy. Last treatment 08/12/21.).   The following portions of the chart were reviewed this encounter and updated as appropriate:       Review of Systems:  No other skin or systemic complaints except as noted in HPI or Assessment and Plan.  Objective  Well appearing patient in no apparent distress; mood and affect are within normal limits.  A focused examination was performed including back, legs. Relevant physical exam findings are noted in the Assessment and Plan.  L spinal upper back 6.18mm pink biopsy site.   Right Leg x 23, Left Leg x 10 (33) Small pink flesh flat papules.    Assessment & Plan  Actinic Damage - chronic, secondary to cumulative UV radiation exposure/sun exposure over time - diffuse scaly erythematous macules with underlying dyspigmentation - Recommend daily broad spectrum sunscreen SPF 30+ to sun-exposed areas, reapply every 2 hours as needed.  - Recommend staying in the shade or wearing long sleeves, sun glasses (UVA+UVB protection) and wide brim hats (4-inch brim around the entire circumference of the hat). - Call for new or changing lesions.  Dysplastic nevus L spinal upper back  Bx-proven Moderate-Severe dysplastic nevus  Epidermal / dermal shaving - L spinal upper back  Lesion diameter (cm):  0.8 Informed consent: discussed and consent obtained   Patient was prepped and draped in usual sterile fashion: Area prepped with alcohol. Anesthesia: the lesion was anesthetized in a standard fashion   Anesthetic:  1% lidocaine w/ epinephrine 1-100,000 buffered w/ 8.4% NaHCO3 Instrument used: flexible razor blade   Hemostasis achieved with: pressure, aluminum chloride and electrodesiccation   Outcome: patient tolerated  procedure well   Post-procedure details: wound care instructions given   Post-procedure details comment:  Ointment and small bandage applied  Specimen 1 - Surgical pathology Differential Diagnosis: Dysplastic Nevus with Mod to Severe Atypia Check Margins: Yes 6.74mm pink biopsy site. [id number]  Flat wart (33) Right Leg x 23, Left Leg x 10  Improving.   Discussed viral etiology and risk of spread.  Discussed multiple treatments may be required to clear warts.  Discussed possible post-treatment dyspigmentation and risk of recurrence.  Pt using electric shaver.  Destruction of lesion - Right Leg x 23, Left Leg x 10  Destruction method: cryotherapy   Informed consent: discussed and consent obtained   Lesion destroyed using liquid nitrogen: Yes   Region frozen until ice ball extended beyond lesion: Yes   Outcome: patient tolerated procedure well with no complications   Post-procedure details: wound care instructions given   Additional details:  Prior to procedure, discussed risks of blister formation, small wound, skin dyspigmentation, or rare scar following cryotherapy. Recommend Vaseline ointment to treated areas while healing.    Return as scheduled with Dr Nicole Kindred.  IJamesetta Orleans, CMA, am acting as scribe for Brendolyn Patty, MD .  Documentation: I have reviewed the above documentation for accuracy and completeness, and I agree with the above.  Brendolyn Patty MD

## 2021-09-30 ENCOUNTER — Telehealth: Payer: Self-pay

## 2021-09-30 NOTE — Telephone Encounter (Signed)
Advised pt of bx results/sh ?

## 2021-09-30 NOTE — Telephone Encounter (Signed)
-----   Message from Brendolyn Patty, MD sent at 09/30/2021  4:39 PM EST ----- Skin , left spinal upper back RESIDUAL DYSPLASTIC NEVUS, MARGIN FREE   - please call patient

## 2021-10-07 ENCOUNTER — Other Ambulatory Visit (INDEPENDENT_AMBULATORY_CARE_PROVIDER_SITE_OTHER): Payer: BC Managed Care – PPO

## 2021-10-07 ENCOUNTER — Other Ambulatory Visit: Payer: Self-pay

## 2021-10-07 DIAGNOSIS — Z0000 Encounter for general adult medical examination without abnormal findings: Secondary | ICD-10-CM

## 2021-10-07 DIAGNOSIS — Z13220 Encounter for screening for lipoid disorders: Secondary | ICD-10-CM | POA: Diagnosis not present

## 2021-10-07 DIAGNOSIS — Z1389 Encounter for screening for other disorder: Secondary | ICD-10-CM

## 2021-10-07 DIAGNOSIS — Z1329 Encounter for screening for other suspected endocrine disorder: Secondary | ICD-10-CM

## 2021-10-07 DIAGNOSIS — E559 Vitamin D deficiency, unspecified: Secondary | ICD-10-CM | POA: Diagnosis not present

## 2021-10-07 DIAGNOSIS — E538 Deficiency of other specified B group vitamins: Secondary | ICD-10-CM

## 2021-10-07 LAB — LIPID PANEL
Cholesterol: 175 mg/dL (ref 0–200)
HDL: 70.8 mg/dL (ref >39.00)
LDL Cholesterol: 96 mg/dL (ref 0–99)
NonHDL: 104.62
Total CHOL/HDL Ratio: 2
Triglycerides: 42 mg/dL (ref 0.0–149.0)
VLDL: 8.4 mg/dL (ref 0.0–40.0)

## 2021-10-07 LAB — COMPREHENSIVE METABOLIC PANEL
ALT: 16 U/L (ref 0–35)
AST: 15 U/L (ref 0–37)
Albumin: 4.1 g/dL (ref 3.5–5.2)
Alkaline Phosphatase: 65 U/L (ref 39–117)
BUN: 14 mg/dL (ref 6–23)
CO2: 30 mEq/L (ref 19–32)
Calcium: 9.3 mg/dL (ref 8.4–10.5)
Chloride: 104 mEq/L (ref 96–112)
Creatinine, Ser: 0.92 mg/dL (ref 0.40–1.20)
GFR: 71.06 mL/min (ref >60.00)
Glucose, Bld: 86 mg/dL (ref 70–99)
Potassium: 3.7 mEq/L (ref 3.5–5.1)
Sodium: 141 mEq/L (ref 135–145)
Total Bilirubin: 0.7 mg/dL (ref 0.2–1.2)
Total Protein: 6.7 g/dL (ref 6.0–8.3)

## 2021-10-07 LAB — CBC WITH DIFFERENTIAL/PLATELET
Basophils Absolute: 0 10*3/uL (ref 0.0–0.1)
Basophils Relative: 0.2 % (ref 0.0–3.0)
Eosinophils Absolute: 0.1 10*3/uL (ref 0.0–0.7)
Eosinophils Relative: 0.7 % (ref 0.0–5.0)
HCT: 40.8 % (ref 36.0–46.0)
Hemoglobin: 13.4 g/dL (ref 12.0–15.0)
Lymphocytes Relative: 17.2 % (ref 12.0–46.0)
Lymphs Abs: 1.5 10*3/uL (ref 0.7–4.0)
MCHC: 32.8 g/dL (ref 30.0–36.0)
MCV: 94.8 fl (ref 78.0–100.0)
Monocytes Absolute: 0.7 10*3/uL (ref 0.1–1.0)
Monocytes Relative: 7.5 % (ref 3.0–12.0)
Neutro Abs: 6.5 10*3/uL (ref 1.4–7.7)
Neutrophils Relative %: 74.4 % (ref 43.0–77.0)
Platelets: 187 10*3/uL (ref 150.0–400.0)
RBC: 4.3 Mil/uL (ref 3.87–5.11)
RDW: 13.1 % (ref 11.5–15.5)
WBC: 8.8 10*3/uL (ref 4.0–10.5)

## 2021-10-07 LAB — VITAMIN D 25 HYDROXY (VIT D DEFICIENCY, FRACTURES): VITD: 66.41 ng/mL (ref 30.00–100.00)

## 2021-10-07 LAB — VITAMIN B12: Vitamin B-12: 662 pg/mL (ref 211–911)

## 2021-10-07 LAB — TSH: TSH: 1.75 u[IU]/mL (ref 0.35–5.50)

## 2021-10-08 LAB — URINALYSIS, ROUTINE W REFLEX MICROSCOPIC
Bilirubin Urine: NEGATIVE
Glucose, UA: NEGATIVE
Hgb urine dipstick: NEGATIVE
Ketones, ur: NEGATIVE
Leukocytes,Ua: NEGATIVE
Nitrite: NEGATIVE
Protein, ur: NEGATIVE
Specific Gravity, Urine: 1.017 (ref 1.001–1.035)
pH: 8 (ref 5.0–8.0)

## 2022-02-02 ENCOUNTER — Ambulatory Visit: Payer: BC Managed Care – PPO | Admitting: Dermatology

## 2022-02-02 DIAGNOSIS — D2361 Other benign neoplasm of skin of right upper limb, including shoulder: Secondary | ICD-10-CM

## 2022-02-02 DIAGNOSIS — C44612 Basal cell carcinoma of skin of right upper limb, including shoulder: Secondary | ICD-10-CM

## 2022-02-02 DIAGNOSIS — D2271 Melanocytic nevi of right lower limb, including hip: Secondary | ICD-10-CM

## 2022-02-02 DIAGNOSIS — L603 Nail dystrophy: Secondary | ICD-10-CM

## 2022-02-02 DIAGNOSIS — L719 Rosacea, unspecified: Secondary | ICD-10-CM | POA: Diagnosis not present

## 2022-02-02 DIAGNOSIS — L570 Actinic keratosis: Secondary | ICD-10-CM | POA: Diagnosis not present

## 2022-02-02 DIAGNOSIS — D492 Neoplasm of unspecified behavior of bone, soft tissue, and skin: Secondary | ICD-10-CM

## 2022-02-02 DIAGNOSIS — D225 Melanocytic nevi of trunk: Secondary | ICD-10-CM

## 2022-02-02 DIAGNOSIS — L814 Other melanin hyperpigmentation: Secondary | ICD-10-CM

## 2022-02-02 DIAGNOSIS — Z1283 Encounter for screening for malignant neoplasm of skin: Secondary | ICD-10-CM

## 2022-02-02 DIAGNOSIS — D229 Melanocytic nevi, unspecified: Secondary | ICD-10-CM

## 2022-02-02 DIAGNOSIS — Z86018 Personal history of other benign neoplasm: Secondary | ICD-10-CM

## 2022-02-02 DIAGNOSIS — B079 Viral wart, unspecified: Secondary | ICD-10-CM

## 2022-02-02 DIAGNOSIS — L578 Other skin changes due to chronic exposure to nonionizing radiation: Secondary | ICD-10-CM

## 2022-02-02 DIAGNOSIS — L821 Other seborrheic keratosis: Secondary | ICD-10-CM | POA: Diagnosis not present

## 2022-02-02 DIAGNOSIS — Z85828 Personal history of other malignant neoplasm of skin: Secondary | ICD-10-CM

## 2022-02-02 DIAGNOSIS — D2371 Other benign neoplasm of skin of right lower limb, including hip: Secondary | ICD-10-CM

## 2022-02-02 DIAGNOSIS — D2372 Other benign neoplasm of skin of left lower limb, including hip: Secondary | ICD-10-CM

## 2022-02-02 NOTE — Patient Instructions (Addendum)
Wound Care Instructions ? ?Cleanse wound gently with soap and water once a day then pat dry with clean gauze. Apply a thing coat of Petrolatum (petroleum jelly, "Vaseline") over the wound (unless you have an allergy to this). We recommend that you use a new, sterile tube of Vaseline. Do not pick or remove scabs. Do not remove the yellow or white "healing tissue" from the base of the wound. ? ?Cover the wound with fresh, clean, nonstick gauze and secure with paper tape. You may use Band-Aids in place of gauze and tape if the would is small enough, but would recommend trimming much of the tape off as there is often too much. Sometimes Band-Aids can irritate the skin. ? ?You should call the office for your biopsy report after 1 week if you have not already been contacted. ? ?If you experience any problems, such as abnormal amounts of bleeding, swelling, significant bruising, significant pain, or evidence of infection, please call the office immediately. ? ?FOR ADULT SURGERY PATIENTS: If you need something for pain relief you may take 1 extra strength Tylenol (acetaminophen) AND 2 Ibuprofen ('200mg'$  each) together every 4 hours as needed for pain. (do not take these if you are allergic to them or if you have a reason you should not take them.) Typically, you may only need pain medication for 1 to 3 days.  ? ? ? ?Cryotherapy Aftercare ? ?Wash gently with soap and water everyday.   ?Apply Vaseline and Band-Aid daily until healed.  ? ? ? ? ?If You Need Anything After Your Visit ? ?If you have any questions or concerns for your doctor, please call our main line at [phone] and press option 4 to reach your doctor's medical assistant. If no one answers, please leave a voicemail as directed and we will return your call as soon as possible. Messages left after 4 pm will be answered the following business day.  ? ?You may also send [place] a message via MyChart. We typically respond to MyChart messages within 1-2 business  days. ? ?For prescription refills, please ask your pharmacy to contact our office. Our fax number is [phone]. ? ?If you have an urgent issue when the clinic is closed that cannot wait until the next business day, you can page your doctor at the number below.   ? ?Please note that while we do our best to be available for urgent issues outside of office hours, we are not available 24/7.  ? ?If you have an urgent issue and are unable to reach [place], you may choose to seek medical care at your doctor's office, retail clinic, urgent care center, or emergency room. ? ?If you have a medical emergency, please immediately call 911 or go to the emergency department. ? ?Pager Numbers ? ?- Dr. Nehemiah Massed: [phone] ? ?- Dr. Laurence Ferrari: [phone] ? ?- Dr. Nicole Kindred: [phone] ? ?In the event of inclement weather, please call our main line at [phone] for an update on the status of any delays or closures. ? ?Dermatology Medication Tips: ?Please keep the boxes that topical medications come in in order to help keep track of the instructions about where and how to use these. Pharmacies typically print the medication instructions only on the boxes and not directly on the medication tubes.  ? ?If your medication is too expensive, please contact our office at [phone] option 4 or send [place] a message through [place].  ? ?We are unable to tell what your co-pay for medications will be in  advance as this is different depending on your insurance coverage. However, we may be able to find a substitute medication at lower cost or fill out paperwork to get insurance to cover a needed medication.  ? ?If a prior authorization is required to get your medication covered by your insurance company, please allow [place] 1-2 business days to complete this process. ? ?Drug prices often vary depending on where the prescription is filled and some pharmacies may offer cheaper prices. ? ?The website www.goodrx.com contains coupons for medications through  different pharmacies. The prices here do not account for what the cost may be with help from insurance (it may be cheaper with your insurance), but the website can give you the price if you did not use any insurance.  ?- You can print the associated coupon and take it with your prescription to the pharmacy.  ?- You may also stop by our office during regular business hours and pick up a GoodRx coupon card.  ?- If you need your prescription sent electronically to a different pharmacy, notify our office through [facility] or by phone at [phone] option 4. ? ? ? ? ?Si Usted Necesita Algo Despu?s de Su Visita ? ?Tambi?n puede enviarnos un mensaje a trav?s de MyChart. Por lo general respondemos a los mensajes de MyChart en el transcurso de 1 a 2 d?as h?biles. ? ?Para renovar recetas, por favor pida a su farmacia que se ponga en contacto con nuestra oficina. Nuestro n?mero de fax es el [phone]. ? ?Si tiene un asunto urgente cuando la cl?nica est? cerrada y que no puede esperar hasta el siguiente d?a h?bil, puede llamar/localizar a su doctor(a) al n?mero que aparece a continuaci?n.  ? ?Por favor, tenga en cuenta que aunque hacemos todo lo posible para estar disponibles para asuntos urgentes fuera del horario de oficina, no estamos disponibles las 24 horas del d?a, los 7 d?as de la semana.  ? ?Si tiene un problema urgente y no puede comunicarse con nosotros, puede optar por buscar atenci?n m?dica  en el consultorio de su doctor(a), en una cl?nica privada, en un centro de atenci?n urgente o en una sala de emergencias. ? ?Si tiene Engineer, maintenance (IT) m?dica, por favor llame inmediatamente al 911 o vaya a la sala de emergencias. ? ?N?meros de b?per ? ?- Dr. Nehemiah Massed: [phone] ? ?- Dra. Moye: [phone] ? ?- Dra. Nicole Kindred: [phone] ? ?En caso de inclemencias del tiempo, por favor llame a nuestra l?nea principal al [phone] para una actualizaci?n sobre el estado de cualquier retraso o cierre. ? ?Consejos  para la medicaci?n en dermatolog?a: ?Por favor, guarde las cajas en las que vienen los medicamentos de uso t?pico para ayudarle a seguir las instrucciones sobre d?nde y c?mo usarlos. Las farmacias generalmente imprimen las instrucciones del medicamento s?lo en las cajas y no directamente en los tubos del [place].  ? ?Si su medicamento es muy caro, por favor, p?ngase en contacto con Zigmund Daniel llamando al [phone] y presione la opci?n 4 o env?enos un mensaje a trav?s de MyChart.  ? ?No podemos decirle cu?l ser? su copago por los medicamentos por adelantado ya que esto es diferente dependiendo de la cobertura de su seguro. Sin embargo, es posible que podamos encontrar un medicamento sustituto a Electrical engineer un formulario para que el seguro cubra el medicamento que se considera necesario.  ? ?Si se requiere Ardelia Mems autorizaci?n previa para que su compa??a de seguros [place] su medicamento, por favor perm?tanos de 1 a 2  d?as h?biles para completar este proceso. ? ?Los precios de los medicamentos var?an con frecuencia dependiendo del Environmental consultant de d?nde se surte la receta y alguna farmacias pueden ofrecer precios m?s baratos. ? ?El sitio web www.goodrx.com tiene cupones para medicamentos de Airline pilot. Los precios aqu? no tienen en cuenta lo que podr?a costar con la ayuda del seguro (puede ser m?s barato con su seguro), pero el sitio web puede darle el precio si no utiliz? ning?n seguro.  ?- Puede imprimir el cup?n correspondiente y llevarlo con su receta a la farmacia.  ?- Tambi?n puede pasar por nuestra oficina durante el horario de atenci?n regular y recoger una tarjeta de cupones de GoodRx.  ?- Si necesita que su receta se env?e electr?nicamente a Chiropodist, informe a nuestra oficina a trav?s de MyChart de [place] o por tel?fono llamando al [phone] y presione la opci?n 4.  ?

## 2022-02-02 NOTE — Progress Notes (Signed)
? ?Follow-Up Visit ?  ?Subjective  ?Kara Spencer is a 56 y.o. female who presents for the following: Total body skin exam (Hx of BCC, Dysplastic nevi) and check dark spot (L cheek, few weeks).  H/o flat warts legs.  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  She has persistent facial redness. ? ?The patient presents for Total-Body Skin Exam (TBSE) for skin cancer screening and mole check.  The patient has spots, moles and lesions to be evaluated, some may be new or changing and the patient has concerns that these could be cancer. ? ? ?The following portions of the chart were reviewed this encounter and updated as appropriate:  ?  ?  ? ?Review of Systems:  No other skin or systemic complaints except as noted in HPI or Assessment and Plan. ? ?Objective  ?Well appearing patient in no apparent distress; mood and affect are within normal limits. ? ?A full examination was performed including scalp, head, eyes, ears, nose, lips, neck, chest, axillae, abdomen, back, buttocks, bilateral upper extremities, bilateral lower extremities, hands, feet, fingers, toes, fingernails, and toenails. All findings within normal limits unless otherwise noted below. ? ?R lateral buttock; R lower hip/upper thigh; R upper pretibia; superior umbilicus; spinal upper back ? ?R lateral buttock ?7.0 x 3.62m med brown macule appears as 2 adjacent ? ?R lower hip/upper thigh ?3.036mmed brown macule ? ?R upper pretibia ?2.61m10med brown macule ? ?spinal upper back ?5.0mm43meckled tan macule with darker medial 1.0mm 33mule, adjacent to bx site, stable compared to photo ? ?superior umbilicus ?4.0mm b20mn macule ? ?L mid cheek ?Stuck-on, waxy, tan-brown macule --Discussed benign etiology and prognosis.  ? ?face ?Erythema face at cheeks, forehead, chin ? ?fingernails bil hands ?Nail dystrophy (onycholysis) on fingernails improved as compared to photos ? ?R medial shoulder ?4.0mm fl43m tan pap ? ? ? ? ?R upper clavicle x 1, L lower clavicle medial to  white scar x 1 (2) ?Pink scaly macules ? ?R foot/ankle x7, R lower thigh x 4, L lower leg x 5, R knee x 1 (17) ?Tiny flat pink/flesh papules-- Discussed viral etiology and contagion.  ? ? ? ?Assessment & Plan  ? ?Lentigines ?- Scattered tan macules ?- Due to sun exposure ?- Benign-appearing, observe ?- Recommend daily broad spectrum sunscreen SPF 30+ to sun-exposed areas, reapply every 2 hours as needed. ?- Call for any changes ?- back ? ?Seborrheic Keratoses ?- Stuck-on, waxy, tan-brown papules and/or plaques  ?- Benign-appearing ?- Discussed benign etiology and prognosis. ?- Observe ?- Call for any changes ?- back ? ?Melanocytic Nevi ?- Tan-brown and/or pink-flesh-colored symmetric macules and papules ?- Benign appearing on exam today ?- Observation ?- Call clinic for new or changing moles ?- Recommend daily use of broad spectrum spf 30+ sunscreen to sun-exposed areas.  ? ?Dermatofibroma ?- Firm pink/brown papulenodule with dimple sign ?- Benign appearing ?- Call for any changes  ?- R arm, legs ? ?Actinic Damage ?- Chronic condition, secondary to cumulative UV/sun exposure ?- diffuse scaly erythematous macules with underlying dyspigmentation ?- Recommend daily broad spectrum sunscreen SPF 30+ to sun-exposed areas, reapply every 2 hours as needed.  ?- Staying in the shade or wearing long sleeves, sun glasses (UVA+UVB protection) and wide brim hats (4-inch brim around the entire circumference of the hat) are also recommended for sun protection.  ?- Call for new or changing lesions. ? ?Skin cancer screening performed today. ? ?History of Dysplastic Nevi ?- No evidence of recurrence today ?-  Recommend regular full body skin exams ?- Recommend daily broad spectrum sunscreen SPF 30+ to sun-exposed areas, reapply every 2 hours as needed.  ?- Call if any new or changing lesions are noted between office visits  ?- L upper abdomen, L post shoulder, L sternum, R spinal lower back, L spinal upper back ? ?History of Basal Cell  Carcinoma of the Skin ?- No evidence of recurrence today ?- Recommend regular full body skin exams ?- Recommend daily broad spectrum sunscreen SPF 30+ to sun-exposed areas, reapply every 2 hours as needed.  ?- Call if any new or changing lesions are noted between office visits  ?- L mid lower back, R lower neck, R neck ? ?Nevus (5) ?R lateral buttock; R lower hip/upper thigh; R upper pretibia; superior umbilicus; spinal upper back ? ?Benign-appearing.  Observation.  Call clinic for new or changing moles.  Recommend daily use of broad spectrum spf 30+ sunscreen to sun-exposed areas.   ? ?Seborrheic keratosis ?L mid cheek ? ?Benign, observe ?Discussed cosmetic LN2 ? ?Discussed cosmetic procedure, noncovered.  $60 for 1st lesion and $15 for each additional lesion if done on the same day.  Maximum charge $350.  One touch-up treatment included no charge. Discussed risks of treatment including dyspigmentation, small scar, and/or recurrence. Recommend daily broad spectrum sunscreen SPF 30+/photoprotection to treated areas once healed.  ? ?Rosacea ?face ? ?Chronic and persistent condition with duration or expected duration over one year. Condition is bothersome/symptomatic for patient. Currently flared.  ? ?Rosacea is a chronic progressive skin condition usually affecting the face of adults, causing redness and/or acne bumps. It is treatable but not curable. It sometimes affects the eyes (ocular rosacea) as well. It may respond to topical and/or systemic medication and can flare with stress, sun exposure, alcohol, exercise and some foods.  Daily application of broad spectrum spf 30+ sunscreen to face is recommended to reduce flares. ? ?Discussed the treatment option of BBL/laser.  Typically we recommend 1-3 treatment sessions about 5-8 weeks apart for best results.  The patient's condition may require "maintenance treatments" in the future.  The fee for BBL / laser treatments is $350 per treatment session for the whole  face.  A fee can be quoted for other parts of the body. ?Insurance typically does not pay for BBL/laser treatments and therefore the fee is an out-of-pocket cost. ? ?Pt interested in scheduling for fall ?Pt will start Elta MD clear tinted spf daily ?Start Finacea foam qd/bid to face ? ? ?Nail dystrophy ?fingernails bil hands ? ?Improved ?Benign, observe ? ?Neoplasm of skin ?R medial shoulder ? ?Epidermal / dermal shaving ? ?Lesion diameter (cm):  0.4 ?Informed consent: discussed and consent obtained   ?Patient was prepped and draped in usual sterile fashion: area prepped with alcohol. ?Anesthesia: the lesion was anesthetized in a standard fashion   ?Anesthetic:  1% lidocaine w/ epinephrine 1-100,000 buffered w/ 8.4% NaHCO3 ?Instrument used: flexible razor blade   ?Hemostasis achieved with: pressure, aluminum chloride and electrodesiccation   ?Outcome: patient tolerated procedure well   ?Post-procedure details: wound care instructions given   ?Post-procedure details comment:  Ointment and small bandage applied ? ?Specimen 1 - Surgical pathology ?Differential Diagnosis: D48.5 Irritated Nevus r/o BCC ? ?Check Margins: yes ?4.29m flesh tan pap ? ?AK (actinic keratosis) (2) ?R upper clavicle x 1, L lower clavicle medial to white scar x 1 ? ?Recheck L clavicle on f/up ? ?Destruction of lesion - R upper clavicle x 1, L lower clavicle medial  to white scar x 1 ? ?Destruction method: cryotherapy   ?Informed consent: discussed and consent obtained   ?Lesion destroyed using liquid nitrogen: Yes   ?Region frozen until ice ball extended beyond lesion: Yes   ?Outcome: patient tolerated procedure well with no complications   ?Post-procedure details: wound care instructions given   ?Additional details:  Prior to procedure, discussed risks of blister formation, small wound, skin dyspigmentation, or rare scar following cryotherapy. Recommend Vaseline ointment to treated areas while healing.  ? ?Viral warts, unspecified type (17) ?R  foot/ankle x7, R lower thigh x 4, L lower leg x 5, R knee x 1 ? ?Discussed viral etiology and risk of spread.  Discussed multiple treatments may be required to clear warts.  Discussed possible post-treatment

## 2022-02-03 ENCOUNTER — Telehealth: Payer: Self-pay

## 2022-02-03 MED ORDER — FINACEA 15 % EX FOAM: 1.0000 "application " | CUTANEOUS | 4 refills | Status: DC

## 2022-02-03 NOTE — Telephone Encounter (Signed)
-----   Message from Brendolyn Patty, MD sent at 02/02/2022  7:51 PM EDT ----- ?Can you send in [place] foam qd/bid to face for rosacea for this patient?  She is scheduled for BBL in the fall, but she can try a topical in the interim to see if that helps with the redness.  Apply first, then apply sunscreen in AM.  Thanks ? ?

## 2022-02-03 NOTE — Telephone Encounter (Signed)
Discussed the Finacea foam with patient.  She said she didn't have any redness today so she was not sure if she needed.  I advised I would send in for her and if she wanted to start the foam it would be there for her.  Finacea foam qd/bid 50g 4rf to [facility]. ?

## 2022-02-04 ENCOUNTER — Telehealth: Payer: Self-pay

## 2022-02-04 NOTE — Telephone Encounter (Signed)
Patient advised of BX results and scheduled for EDC. aw 

## 2022-02-04 NOTE — Telephone Encounter (Signed)
-----   Message from Brendolyn Patty, MD sent at 02/03/2022  6:22 PM EDT ----- ?Skin , right medial shoulder ?BASAL CELL CARCINOMA, NODULAR PATTERN, MARGIN CLOSE ? ?BCC skin cancer, needs EDC ? ? - please call patient ?

## 2022-02-04 NOTE — Telephone Encounter (Signed)
Left pt msg to call for bx result/sh °

## 2022-02-25 ENCOUNTER — Ambulatory Visit: Payer: BC Managed Care – PPO | Admitting: Dermatology

## 2022-02-25 ENCOUNTER — Encounter: Payer: Self-pay | Admitting: Dermatology

## 2022-02-25 DIAGNOSIS — C44519 Basal cell carcinoma of skin of other part of trunk: Secondary | ICD-10-CM | POA: Diagnosis not present

## 2022-02-25 DIAGNOSIS — L578 Other skin changes due to chronic exposure to nonionizing radiation: Secondary | ICD-10-CM | POA: Diagnosis not present

## 2022-02-25 NOTE — Progress Notes (Signed)
   Follow-Up Visit   Subjective  Kara Spencer is a 56 y.o. female who presents for the following: Bx proven BCC (R med shoulder - pt here today for [facility]).   The following portions of the chart were reviewed this encounter and updated as appropriate:       Review of Systems:  No other skin or systemic complaints except as noted in HPI or Assessment and Plan.  Objective  Well appearing patient in no apparent distress; mood and affect are within normal limits.  A focused examination was performed including the trunk. Relevant physical exam findings are noted in the Assessment and Plan.  R med shoulder Pink biopsy site    Assessment & Plan  Basal cell carcinoma (BCC) of skin of other part of torso R med shoulder  Destruction of lesion  Destruction method: electrodesiccation and curettage   Informed consent: discussed and consent obtained   Timeout:  patient name, date of birth, surgical site, and procedure verified Anesthesia: the lesion was anesthetized in a standard fashion   Anesthetic:  1% lidocaine w/ epinephrine 1-100,000 local infiltration Curettage performed in three different directions: Yes   Electrodesiccation performed over the curetted area: Yes   Final wound size (cm):  0.6 Hemostasis achieved with:  pressure, aluminum chloride and electrodesiccation Outcome: patient tolerated procedure well with no complications   Post-procedure details: wound care instructions given   Additional details:  Mupirocin ointment and Bandaid applied     Actinic Damage - chronic, secondary to cumulative UV radiation exposure/sun exposure over time - diffuse scaly erythematous macules with underlying dyspigmentation - Recommend daily broad spectrum sunscreen SPF 30+ to sun-exposed areas, reapply every 2 hours as needed.  - Recommend staying in the shade or wearing long sleeves, sun glasses (UVA+UVB protection) and wide brim hats (4-inch brim around the entire circumference of  the hat). - Call for new or changing lesions.  Return for appointment as scheduled.  Luther Redo, CMA, am acting as scribe for Brendolyn Patty, MD .  Documentation: I have reviewed the above documentation for accuracy and completeness, and I agree with the above.  Brendolyn Patty MD

## 2022-02-25 NOTE — Patient Instructions (Signed)
Electrodesiccation and Curettage ("Scrape and Burn") Wound Care Instructions  Leave the original bandage on for 24 hours if possible.  If the bandage becomes soaked or soiled before that time, it is OK to remove it and examine the wound.  A small amount of post-operative bleeding is normal.  If excessive bleeding occurs, remove the bandage, place gauze over the site and apply continuous pressure (no peeking) over the area for 30 minutes. If this does not work, please call our clinic as soon as possible or page your doctor if it is after hours.   Once a day, cleanse the wound with soap and water. It is fine to shower. If a thick crust develops you may use a Q-tip dipped into dilute hydrogen peroxide (mix 1:1 with water) to dissolve it.  Hydrogen peroxide can slow the healing process, so use it only as needed.    After washing, apply petroleum jelly (Vaseline) or an antibiotic ointment if your doctor prescribed one for you, followed by a bandage.    For best healing, the wound should be covered with a layer of ointment at all times. If you are not able to keep the area covered with a bandage to hold the ointment in place, this may mean re-applying the ointment several times a day.  Continue this wound care until the wound has healed and is no longer open. It may take several weeks for the wound to heal and close.  Itching and mild discomfort is normal during the healing process.  If you have any discomfort, you can take Tylenol (acetaminophen) or ibuprofen as directed on the bottle. (Please do not take these if you have an allergy to them or cannot take them for another reason).  Some redness, tenderness and white or yellow material in the wound is normal healing.  If the area becomes very sore and red, or develops a thick yellow-green material (pus), it may be infected; please notify us.    Wound healing continues for up to one year following surgery. It is not unusual to experience pain in the scar  from time to time during the interval.  If the pain becomes severe or the scar thickens, you should notify the office.    A slight amount of redness in a scar is expected for the first six months.  After six months, the redness will fade and the scar will soften and fade.  The color difference becomes less noticeable with time.  If there are any problems, return for a post-op surgery check at your earliest convenience.  To improve the appearance of the scar, you can use silicone scar gel, cream, or sheets (such as Mederma or Serica) every night for up to one year. These are available over the counter (without a prescription).  Please call our office at [phone] for any questions or concerns.     Due to recent changes in healthcare laws, you may see results of your pathology and/or laboratory studies on MyChart before the doctors have had a chance to review them. We understand that in some cases there may be results that are confusing or concerning to you. Please understand that not all results are received at the same time and often the doctors may need to interpret multiple results in order to provide you with the best plan of care or course of treatment. Therefore, we ask that you please give us 2 business days to thoroughly review all your results before contacting the office for clarification. Should   we see a critical lab result, you will be contacted sooner.   If You Need Anything After Your Visit  If you have any questions or concerns for your doctor, please call our main line at [phone] and press option 4 to reach your doctor's medical assistant. If no one answers, please leave a voicemail as directed and we will return your call as soon as possible. Messages left after 4 pm will be answered the following business day.   You may also send us a message via MyChart. We typically respond to MyChart messages within 1-2 business days.  For prescription refills, please ask your  pharmacy to contact our office. Our fax number is [phone].  If you have an urgent issue when the clinic is closed that cannot wait until the next business day, you can page your doctor at the number below.    Please note that while we do our best to be available for urgent issues outside of office hours, we are not available 24/7.   If you have an urgent issue and are unable to reach us, you may choose to seek medical care at your doctor's office, retail clinic, urgent care center, or emergency room.  If you have a medical emergency, please immediately call 911 or go to the emergency department.  Pager Numbers  - Dr. Kowalski: [phone]  - Dr. Moye: [phone]  - Dr. Stewart: [phone]  In the event of inclement weather, please call our main line at [phone] for an update on the status of any delays or closures.  Dermatology Medication Tips: Please keep the boxes that topical medications come in in order to help keep track of the instructions about where and how to use these. Pharmacies typically print the medication instructions only on the boxes and not directly on the medication tubes.   If your medication is too expensive, please contact our office at [phone] option 4 or send us a message through MyChart.   We are unable to tell what your co-pay for medications will be in advance as this is different depending on your insurance coverage. However, we may be able to find a substitute medication at lower cost or fill out paperwork to get insurance to cover a needed medication.   If a prior authorization is required to get your medication covered by your insurance company, please allow us 1-2 business days to complete this process.  Drug prices often vary depending on where the prescription is filled and some pharmacies may offer cheaper prices.  The website www.goodrx.com contains coupons for medications through different pharmacies. The prices here do not  account for what the cost may be with help from insurance (it may be cheaper with your insurance), but the website can give you the price if you did not use any insurance.  - You can print the associated coupon and take it with your prescription to the pharmacy.  - You may also stop by our office during regular business hours and pick up a GoodRx coupon card.  - If you need your prescription sent electronically to a different pharmacy, notify our office through [place] MyChart or by phone at [phone] option 4.     Si Usted Necesita Algo Despus de Su Visita  Tambin puede enviarnos un mensaje a travs de MyChart. Por lo general respondemos a los mensajes de MyChart en el transcurso de 1 a 2 das hbiles.  Para renovar recetas, por favor pida a su farmacia que se ponga en   contacto con nuestra oficina. Nuestro nmero de fax es el [phone].  Si tiene un asunto urgente cuando la clnica est cerrada y que no puede esperar hasta el siguiente da hbil, puede llamar/localizar a su doctor(a) al nmero que aparece a continuacin.   Por favor, tenga en cuenta que aunque hacemos todo lo posible para estar disponibles para asuntos urgentes fuera del horario de oficina, no estamos disponibles las 24 horas del da, los 7 das de la semana.   Si tiene un problema urgente y no puede comunicarse con nosotros, puede optar por buscar atencin mdica  en el consultorio de su doctor(a), en una clnica privada, en un centro de atencin urgente o en una sala de emergencias.  Si tiene una emergencia mdica, por favor llame inmediatamente al 911 o vaya a la sala de emergencias.  Nmeros de bper  - Dr. Kowalski: [phone]  - Dra. Moye: [phone]  - Dra. Stewart: [phone]  En caso de inclemencias del tiempo, por favor llame a nuestra lnea principal al [phone] para una actualizacin sobre el estado de cualquier retraso o cierre.  Consejos para la medicacin en dermatologa: Por  favor, guarde las cajas en las que vienen los medicamentos de uso tpico para ayudarle a seguir las instrucciones sobre dnde y cmo usarlos. Las farmacias generalmente imprimen las instrucciones del medicamento slo en las cajas y no directamente en los tubos del medicamento.   Si su medicamento es muy caro, por favor, pngase en contacto con nuestra oficina llamando al [phone] y presione la opcin 4 o envenos un mensaje a travs de MyChart.   No podemos decirle cul ser su copago por los medicamentos por adelantado ya que esto es diferente dependiendo de la cobertura de su seguro. Sin embargo, es posible que podamos encontrar un medicamento sustituto a menor costo o llenar un formulario para que el seguro cubra el medicamento que se considera necesario.   Si se requiere una autorizacin previa para que su compaa de seguros cubra su medicamento, por favor permtanos de 1 a 2 das hbiles para completar este proceso.  Los precios de los medicamentos varan con frecuencia dependiendo del lugar de dnde se surte la receta y alguna farmacias pueden ofrecer precios ms baratos.  El sitio web www.goodrx.com tiene cupones para medicamentos de diferentes farmacias. Los precios aqu no tienen en cuenta lo que podra costar con la ayuda del seguro (puede ser ms barato con su seguro), pero el sitio web puede darle el precio si no utiliz ningn seguro.  - Puede imprimir el cupn correspondiente y llevarlo con su receta a la farmacia.  - Tambin puede pasar por nuestra oficina durante el horario de atencin regular y recoger una tarjeta de cupones de GoodRx.  - Si necesita que su receta se enve electrnicamente a una farmacia diferente, informe a nuestra oficina a travs de MyChart de [place] o por telfono llamando al [phone] y presione la opcin 4.  

## 2022-02-27 DIAGNOSIS — A6000 Herpesviral infection of urogenital system, unspecified: Secondary | ICD-10-CM | POA: Insufficient documentation

## 2022-02-27 DIAGNOSIS — B009 Herpesviral infection, unspecified: Secondary | ICD-10-CM | POA: Insufficient documentation

## 2022-06-16 ENCOUNTER — Encounter: Payer: Self-pay | Admitting: Internal Medicine

## 2022-07-14 ENCOUNTER — Ambulatory Visit: Payer: BC Managed Care – PPO | Admitting: Family Medicine

## 2022-07-14 ENCOUNTER — Encounter: Payer: Self-pay | Admitting: Family Medicine

## 2022-07-14 VITALS — BP 116/74 | HR 69 | Temp 98.1°F | Ht 69.21 in | Wt 150.0 lb

## 2022-07-14 DIAGNOSIS — R200 Anesthesia of skin: Secondary | ICD-10-CM

## 2022-07-14 DIAGNOSIS — Z0000 Encounter for general adult medical examination without abnormal findings: Secondary | ICD-10-CM

## 2022-07-14 DIAGNOSIS — J8410 Pulmonary fibrosis, unspecified: Secondary | ICD-10-CM

## 2022-07-14 DIAGNOSIS — E538 Deficiency of other specified B group vitamins: Secondary | ICD-10-CM | POA: Diagnosis not present

## 2022-07-14 DIAGNOSIS — N926 Irregular menstruation, unspecified: Secondary | ICD-10-CM | POA: Diagnosis not present

## 2022-07-14 DIAGNOSIS — Z13220 Encounter for screening for lipoid disorders: Secondary | ICD-10-CM

## 2022-07-14 DIAGNOSIS — R790 Abnormal level of blood mineral: Secondary | ICD-10-CM

## 2022-07-14 DIAGNOSIS — R7989 Other specified abnormal findings of blood chemistry: Secondary | ICD-10-CM

## 2022-07-14 NOTE — Progress Notes (Signed)
    SUBJECTIVE:   CHIEF COMPLAINT / HPI: transfer care  Patient presents to clinic to transfer care  No acute concerns today.  Here for annual physical   PERTINENT  PMH / PSH:  None  OBJECTIVE:   BP 116/74 (BP Location: Left Arm, Patient Position: Sitting, Cuff Size: Normal)   Pulse 69   Temp 98.1 F (36.7 C) (Oral)   Ht 5' 9.21" (1.758 m)   Wt 150 lb (68 kg)   LMP  (LMP Unknown)   SpO2 99%   BMI 22.02 kg/m    General: Alert, no acute distress Cardio: Normal S1 and S2, RRR, no r/m/g Pulm: CTAB, normal work of breathing Abdomen: Bowel sounds normal. Abdomen soft and non-tender.  Extremities: No peripheral edema.  Neuro: Cranial nerves grossly intact   ASSESSMENT/PLAN:   Annual physical exam Healthy 56 yr old female Mammogram UTD Colonoscopy UTD Last PAP documented 2016, negative.  Could not find most recent results from 2022 exam performed at [facility].  Request documentation to be faxed to office for updating. Tdap UTD COVID, Flu and Pneumonia vaccines UTD Lipids, A1c, CBC, Cmet, Mag, Vit B12, Vit D today Follow up with labs    PDMP Reviewed  Carollee Leitz, MD

## 2022-07-14 NOTE — Patient Instructions (Addendum)
It was a pleasure meeting you today. Thank you for allowing me to take part in your health care.  Our goals for today as we discussed include:  Tetanus booster due in 2025  Schedule appointment for fasting labs 1 week prior to next visit  Please follow-up with PCP in 3 months for annual visit  If you have any questions or concerns, please do not hesitate to call the office at [phone].  I look forward to our next visit and until then take care and stay safe.  Regards,   Carollee Leitz, MD   [facility]

## 2022-07-25 ENCOUNTER — Encounter: Payer: Self-pay | Admitting: Family Medicine

## 2022-07-25 NOTE — Assessment & Plan Note (Signed)
Healthy 56 yr old female Mammogram UTD Colonoscopy UTD Last PAP documented 2016, negative.  Could not find most recent results from 2022 exam performed at [facility].  Request documentation to be faxed to office for updating. Tdap UTD COVID, Flu and Pneumonia vaccines UTD Lipids, A1c, CBC, Cmet, Mag, Vit B12, Vit D today Follow up with labs

## 2022-08-03 ENCOUNTER — Ambulatory Visit: Payer: BC Managed Care – PPO | Admitting: Dermatology

## 2022-08-03 DIAGNOSIS — D2271 Melanocytic nevi of right lower limb, including hip: Secondary | ICD-10-CM

## 2022-08-03 DIAGNOSIS — Z1283 Encounter for screening for malignant neoplasm of skin: Secondary | ICD-10-CM | POA: Diagnosis not present

## 2022-08-03 DIAGNOSIS — L719 Rosacea, unspecified: Secondary | ICD-10-CM | POA: Diagnosis not present

## 2022-08-03 DIAGNOSIS — D2361 Other benign neoplasm of skin of right upper limb, including shoulder: Secondary | ICD-10-CM

## 2022-08-03 DIAGNOSIS — L578 Other skin changes due to chronic exposure to nonionizing radiation: Secondary | ICD-10-CM

## 2022-08-03 DIAGNOSIS — Z86018 Personal history of other benign neoplasm: Secondary | ICD-10-CM

## 2022-08-03 DIAGNOSIS — D2372 Other benign neoplasm of skin of left lower limb, including hip: Secondary | ICD-10-CM

## 2022-08-03 DIAGNOSIS — L570 Actinic keratosis: Secondary | ICD-10-CM

## 2022-08-03 DIAGNOSIS — D225 Melanocytic nevi of trunk: Secondary | ICD-10-CM

## 2022-08-03 DIAGNOSIS — Z85828 Personal history of other malignant neoplasm of skin: Secondary | ICD-10-CM

## 2022-08-03 DIAGNOSIS — D229 Melanocytic nevi, unspecified: Secondary | ICD-10-CM

## 2022-08-03 DIAGNOSIS — L814 Other melanin hyperpigmentation: Secondary | ICD-10-CM

## 2022-08-03 DIAGNOSIS — D2371 Other benign neoplasm of skin of right lower limb, including hip: Secondary | ICD-10-CM

## 2022-08-03 DIAGNOSIS — L821 Other seborrheic keratosis: Secondary | ICD-10-CM

## 2022-08-03 MED ORDER — FLUOROURACIL 5 % EX CREA: TOPICAL_CREAM | Freq: Two times a day (BID) | CUTANEOUS | 2 refills | Status: DC

## 2022-08-03 NOTE — Progress Notes (Signed)
Follow-Up Visit   Subjective  Kara Spencer is a 56 y.o. female who presents for the following: Total body skin exam (Hx of BCCs, Hx of Dysplastic Nevi, hx of AKs), check spot (L neck, ~29yr no symptoms), and Rosacea (Face, pt presents for BBL today). Also brown spots.  The patient presents for Total-Body Skin Exam (TBSE) for skin cancer screening and mole check.  The patient has spots, moles and lesions to be evaluated, some may be new or changing and the patient has concerns that these could be cancer.   The following portions of the chart were reviewed this encounter and updated as appropriate:       Review of Systems:  No other skin or systemic complaints except as noted in HPI or Assessment and Plan.  Objective  Well appearing patient in no apparent distress; mood and affect are within normal limits.  A full examination was performed including scalp, head, eyes, ears, nose, lips, neck, chest, axillae, abdomen, back, buttocks, bilateral upper extremities, bilateral lower extremities, hands, feet, fingers, toes, fingernails, and toenails. All findings within normal limits unless otherwise noted below.  R lat buttocks, R lower hip/upper thigh, R upper pretibia, Spinal upper back, Superior umbilicus R lat buttock - 7.0 x 3.026mmed brown macule appears as 2 adjacent  R lower hip/upper thigh - 3.66m69med brown macule  R upper pretibia - 2.5mm58md brown macule  Spinal upper back - 5.66mm 57mckled tan macule with darker medial 1.66mm m62mle, adjacent to bx site  Superior umbilicus - 4.66mm 9.0ZE macule  Baseline photos of abdomen compared- no changes  face Erythema with telangiectasias medial cheeks, nose               Mid forehead, R temple Stuck-on, waxy, tan-brown papules and plaques -- Discussed benign etiology and prognosis.   face Brown macules face, see previous photos       Assessment & Plan   Lentigines - Scattered tan macules - Due to sun  exposure - Benign-appearing, observe - Recommend daily broad spectrum sunscreen SPF 30+ to sun-exposed areas, reapply every 2 hours as needed. - Call for any changes - back  Seborrheic Keratoses - Stuck-on, waxy, tan-brown papules and/or plaques  - Benign-appearing - Discussed benign etiology and prognosis. - Observe - Call for any changes - back, L inframammary, L neck  Melanocytic Nevi - Tan-brown and/or pink-flesh-colored symmetric macules and papules - Benign appearing on exam today - Observation - Call clinic for new or changing moles - Recommend daily use of broad spectrum spf 30+ sunscreen to sun-exposed areas.  - trunk  Hemangiomas - Red papules - Discussed benign nature - Observe - Call for any changes - trunk  Actinic Damage with PreCancerous Actinic Keratoses Counseling for Topical Chemotherapy Management: Patient exhibits: - Severe, confluent actinic changes with pre-cancerous actinic keratoses that is secondary to cumulative UV radiation exposure over time - Condition that is severe; chronic, not at goal. - diffuse scaly erythematous macules and papules with underlying dyspigmentation - Discussed Prescription "Field Treatment" topical Chemotherapy for Severe, Chronic Confluent Actinic Changes with Pre-Cancerous Actinic Keratoses Field treatment involves treatment of an entire area of skin that has confluent Actinic Changes (Sun/ Ultraviolet light damage) and PreCancerous Actinic Keratoses by method of PhotoDynamic Therapy (PDT) and/or prescription Topical Chemotherapy agents such as 5-fluorouracil, 5-fluorouracil/calcipotriene, and/or imiquimod.  The purpose is to decrease the number of clinically evident and subclinical PreCancerous lesions to prevent progression to development of skin cancer by chemically destroying early  precancer changes that may or may not be visible.  It has been shown to reduce the risk of developing skin cancer in the treated area. As a result  of treatment, redness, scaling, crusting, and open sores may occur during treatment course. One or more than one of these methods may be used and may have to be used several times to control, suppress and eliminate the PreCancerous changes. Discussed treatment course, expected reaction, and possible side effects. - Recommend daily broad spectrum sunscreen SPF 30+ to sun-exposed areas, reapply every 2 hours as needed.  - Staying in the shade or wearing long sleeves, sun glasses (UVA+UVB protection) and wide brim hats (4-inch brim around the entire circumference of the hat) are also recommended. - Call for new or changing lesions.  - Discussed Red Light, Blue light or 5FU/Calcipotriene to chest - Start 5-fluorouracil/calcipotriene cream twice a day for 7-10 days to affected areas including chest. Prescription sent to [facility]. Patient advised they will receive an email to purchase the medication online and have it sent to their home. Patient provided with handout reviewing treatment course and side effects and advised to call or message [place] on MyChart with any concerns.  Reviewed course of treatment and expected reaction.  Patient advised to expect inflammation and crusting and advised that erosions are possible.  Patient advised to be diligent with sun protection during and after treatment. Counseled to keep medication out of reach of children and pets.   Skin cancer screening performed today.   History of Basal Cell Carcinoma of the Skin - No evidence of recurrence today including R medial shoulder - Recommend regular full body skin exams - Recommend daily broad spectrum sunscreen SPF 30+ to sun-exposed areas, reapply every 2 hours as needed.  - Call if any new or changing lesions are noted between office visits  - L mid lower back, R lower neck, R neck, R medial shoulder  History of Dysplastic Nevi - No evidence of recurrence today - Recommend regular full body skin  exams - Recommend daily broad spectrum sunscreen SPF 30+ to sun-exposed areas, reapply every 2 hours as needed.  - Call if any new or changing lesions are noted between office visits  - L upper abdomen, L post shoulder, L sternum, R spinal lower back, L upper abdomen, L spinal upper back  Dermatofibroma - Firm pink/brown papulenodule with dimple sign - Benign appearing - Call for any changes  - L post ankle, R ant thigh, L ant thigh, R forearm  Nevus R lat buttocks, R lower hip/upper thigh, R upper pretibia, Spinal upper back, Superior umbilicus  Benign-appearing. Stable compared to previous visit. Observation.  Call clinic for new or changing moles.  Recommend daily use of broad spectrum spf 30+ sunscreen to sun-exposed areas.    Rosacea face  Chronic and persistent condition with duration or expected duration over one year. Condition is symptomatic/ bothersome to patient. Not currently at goal.   Rosacea is a chronic progressive skin condition usually affecting the face of adults, causing redness and/or acne bumps. It is treatable but not curable. It sometimes affects the eyes (ocular rosacea) as well. It may respond to topical and/or systemic medication and can flare with stress, sun exposure, alcohol, exercise, topical steroids (including hydrocortisone/cortisone 10) and some foods.  Daily application of broad spectrum spf 30+ sunscreen to face is recommended to reduce flares.  Discussed the treatment option of BBL/laser.  Typically we recommend 1-3 treatment sessions about 5-8 weeks  apart for best results.  The patient's condition may require "maintenance treatments" in the future.  The fee for BBL / laser treatments is $350 per treatment session for the whole face.  A fee can be quoted for other parts of the body. Insurance typically does not pay for BBL/laser treatments and therefore the fee is an out-of-pocket cost.  Laser kit and cooling mask given to patient  today  Photorejuvenation - face Prior to the procedure, the patient's past medical history, medications, allergies, and the rare but potential risks and complications were reviewed with the patient and a signed consent was obtained.  Pre and post treatment care was discussed and instructions provided.   [place] BBL - 08/03/22 1500      Patient Details   Skin Type: II    Anesthestic Cream Applied: No    Photo Takes: Yes    Consent Signed: Yes      Treatment Details   Date: 08/03/22    Treatment #: 1    Area: face    Filter: 1st Pass;2nd Pass      1st Pass   Location: F    Device: Filter 560    BBL j/cm2: 26    PW Msec Sec: 27    Cooling Temp: 20    Pulses: 40    34m: this crystal    Patient tolerated the procedure well.   SNancy Fetteravoidance was stressed. The patient will call with any problems, questions or concerns prior to their next appointment.    Seborrheic keratosis Mid forehead, R temple  Reassured benign age-related growth.  Recommend observation.  Discussed cryotherapy if spot(s) become irritated or inflamed.   Lentigines face  BBL today  Discussed the treatment option of BBL/laser.  Typically we recommend 1-3 treatment sessions about 5-8 weeks apart for best results.  The patient's condition may require "maintenance treatments" in the future.  The fee for BBL / laser treatments is $350 per treatment session for the whole face.  A fee can be quoted for other parts of the body. Insurance typically does not pay for BBL/laser treatments and therefore the fee is an out-of-pocket cost.   Photorejuvenation - face Prior to the procedure, the patient's past medical history, medications, allergies, and the rare but potential risks and complications were reviewed with the patient and a signed consent was obtained.  Pre and post treatment care was discussed and instructions provided.   [place] BBL - 08/03/22 1500      Patient Details   Skin Type: II    Anesthestic Cream  Applied: No    Photo Takes: Yes    Consent Signed: Yes      Treatment Details   Date: 08/03/22    Treatment #: 1    Area: face    Filter: 1st Pass;2nd Pass       2nd Pass   Location: F    Device: Filter 515    BBL j/cm2: 12    PW Msec Sec: 10    Cooling Temp: 25    Pulses: 149    15x15: this crystal          Patient tolerated the procedure well.   SNancy Fetteravoidance was stressed. The patient will call with any problems, questions or concerns prior to their next appointment.     Return in about 6 months (around 02/01/2023) for TBSE, Hx of BCC, Hx of Dysplastic nevi, Hx of AKs.  I, SOthelia Pulling RMA, am acting as sEducation administratorfor [employer]  Nicole Kindred, MD .  Documentation: I have reviewed the above documentation for accuracy and completeness, and I agree with the above.  Brendolyn Patty MD   Documentation: I have reviewed the above documentation for accuracy and completeness, and I agree with the above.  Brendolyn Patty MD

## 2022-08-03 NOTE — Patient Instructions (Addendum)
Instructions for [facility]ations  One or more of your medications was sent to the [facility] mail order compounding pharmacy. You will receive an email from them and can purchase the medicine through that link. It will then be mailed to your home at the address you confirmed. If for any reason you do not receive an email from them, please check your spam folder. If you still do not find the email, please let us know. [facility] phone number is [phone].    - Start 5-fluorouracil/calcipotriene cream twice a day for 7-10 days to affected areas including chest. Prescription sent to [facility]. Patient advised they will receive an email to purchase the medication online and have it sent to their home. Patient provided with handout reviewing treatment course and side effects and advised to call or message [place] on MyChart with any concerns.  Reviewed course of treatment and expected reaction.  Patient advised to expect inflammation and crusting and advised that erosions are possible.  Patient advised to be diligent with sun protection during and after treatment. Counseled to keep medication out of reach of children and pets.   5-Fluorouracil/Calcipotriene Patient Education   Actinic keratoses are the dry, red scaly spots on the skin caused by sun damage. A portion of these spots can turn into skin cancer with time, and treating them can help prevent development of skin cancer.   Treatment of these spots requires removal of the defective skin cells. There are various ways to remove actinic keratoses, including freezing with liquid nitrogen, treatment with creams, or treatment with a blue light procedure in the office.   5-fluorouracil cream is a topical cream used to treat actinic keratoses. It works by interfering with the growth of abnormal fast-growing skin cells, such as actinic keratoses. These cells peel off and are replaced by healthy ones.    5-fluorouracil/calcipotriene is a combination of the 5-fluorouracil cream with a vitamin D analog cream called calcipotriene. The calcipotriene alone does not treat actinic keratoses. However, when it is combined with 5-fluorouracil, it helps the 5-fluorouracil treat the actinic keratoses much faster so that the same results can be achieved with a much shorter treatment time.  INSTRUCTIONS FOR 5-FLUOROURACIL/CALCIPOTRIENE CREAM:   5-fluorouracil/calcipotriene cream typically only needs to be used for 4-7 days. A thin layer should be applied twice a day to the treatment areas recommended by your physician.   If your physician prescribed you separate tubes of 5-fluourouracil and calcipotriene, apply a thin layer of 5-fluorouracil followed by a thin layer of calcipotriene.   Avoid contact with your eyes, nostrils, and mouth. Do not use 5-fluorouracil/calcipotriene cream on infected or open wounds.   You will develop redness, irritation and some crusting at areas where you have pre-cancer damage/actinic keratoses. IF YOU DEVELOP PAIN, BLEEDING, OR SIGNIFICANT CRUSTING, STOP THE TREATMENT EARLY - you have already gotten a good response and the actinic keratoses should clear up well.  Wash your hands after applying 5-fluorouracil 5% cream on your skin.   A moisturizer or sunscreen with a minimum SPF 30 should be applied each morning.   Once you have finished the treatment, you can apply a thin layer of Vaseline twice a day to irritated areas to soothe and calm the areas more quickly. If you experience significant discomfort, contact your physician.  For some patients it is necessary to repeat the treatment for best results.  SIDE EFFECTS: When using 5-fluorouracil/calcipotriene cream, you may have mild irritation, such as redness, dryness,  swelling, or a mild burning sensation. This usually resolves within 2 weeks. The more actinic keratoses you have, the more redness and inflammation you can  expect during treatment. Eye irritation has been reported rarely. If this occurs, please let us know.  If you have any trouble using this cream, please call the office. If you have any other questions about this information, please do not hesitate to ask me before you leave the office.  Due to recent changes in healthcare laws, you may see results of your pathology and/or laboratory studies on MyChart before the doctors have had a chance to review them. We understand that in some cases there may be results that are confusing or concerning to you. Please understand that not all results are received at the same time and often the doctors may need to interpret multiple results in order to provide you with the best plan of care or course of treatment. Therefore, we ask that you please give [place] 2 business days to thoroughly review all your results before contacting the office for clarification. Should we see a critical lab result, you will be contacted sooner.   If You Need Anything After Your Visit  If you have any questions or concerns for your doctor, please call our main line at [phone] and press option 4 to reach your doctor's medical assistant. If no one answers, please leave a voicemail as directed and we will return your call as soon as possible. Messages left after 4 pm will be answered the following business day.   You may also send [place] a message via [place]. We typically respond to MyChart messages within 1-2 business days.  For prescription refills, please ask your pharmacy to contact our office. Our fax number is [phone].  If you have an urgent issue when the clinic is closed that cannot wait until the next business day, you can page your doctor at the number below.    Please note that while we do our best to be available for urgent issues outside of office hours, we are not available 24/7.   If you have an urgent issue and are unable to reach [place], you may choose to seek medical care  at your doctor's office, retail clinic, urgent care center, or emergency room.  If you have a medical emergency, please immediately call 911 or go to the emergency department.  Pager Numbers  - Dr. Nehemiah Massed: [phone]  - Dr. Laurence Ferrari: [phone]  - Dr. Nicole Kindred: [phone]  In the event of inclement weather, please call our main line at [phone] for an update on the status of any delays or closures.  Dermatology Medication Tips: Please keep the boxes that topical medications come in in order to help keep track of the instructions about where and how to use these. Pharmacies typically print the medication instructions only on the boxes and not directly on the medication tubes.   If your medication is too expensive, please contact our office at [phone] option 4 or send [place] a message through [place].   We are unable to tell what your co-pay for medications will be in advance as this is different depending on your insurance coverage. However, we may be able to find a substitute medication at lower cost or fill out paperwork to get insurance to cover a needed medication.   If a prior authorization is required to get your medication covered by your insurance company, please allow [place] 1-2 business days to complete this process.  Drug prices  often vary depending on where the prescription is filled and some pharmacies may offer cheaper prices.  The website www.goodrx.com contains coupons for medications through different pharmacies. The prices here do not account for what the cost may be with help from insurance (it may be cheaper with your insurance), but the website can give you the price if you did not use any insurance.  - You can print the associated coupon and take it with your prescription to the pharmacy.  - You may also stop by our office during regular business hours and pick up a GoodRx coupon card.  - If you need your prescription sent electronically to a different pharmacy,  notify our office through [facility] or by phone at [phone] option 4.     Si Usted Necesita Algo Despus de Su Visita  Tambin puede enviarnos un mensaje a travs de Pharmacist, community. Por lo general respondemos a los mensajes de MyChart en el transcurso de 1 a 2 das hbiles.  Para renovar recetas, por favor pida a su farmacia que se ponga en contacto con nuestra oficina. Harland Dingwall de fax es [place] [phone].  Si tiene un asunto urgente cuando la clnica est cerrada y que no puede esperar hasta el siguiente da hbil, puede llamar/localizar a su doctor(a) al nmero que aparece a continuacin.   Por favor, tenga en cuenta que aunque hacemos todo lo posible para estar disponibles para asuntos urgentes fuera del horario de [place], no estamos disponibles las 24 horas del da, los 7 das de la [place].   Si tiene un problema urgente y no puede comunicarse con nosotros, puede optar por buscar atencin mdica  en el consultorio de su doctor(a), en una clnica privada, en un centro de atencin urgente o en una sala de emergencias.  Si tiene Engineering geologist, por favor llame inmediatamente al 911 o vaya a la sala de emergencias.  Nmeros de bper  - Dr. Nehemiah Massed: [phone]  - Dra. Moye: [phone]  - Dra. Nicole Kindred: [phone]  En caso de inclemencias del [place], por favor llame a Johnsie Kindred principal al [phone] para una actualizacin sobre el [place] de cualquier retraso o cierre.  Consejos para la medicacin en dermatologa: Por favor, guarde las cajas en las que vienen los medicamentos de uso tpico para ayudarle a seguir las instrucciones sobre dnde y cmo usarlos. Las farmacias generalmente imprimen las instrucciones del medicamento slo en las cajas y no directamente en los tubos del [place].   Si su medicamento es muy caro, por favor, pngase en contacto con Zigmund Daniel llamando al [phone] y presione la opcin 4 o envenos un mensaje a travs de  Pharmacist, community.   No podemos decirle cul ser su copago por los medicamentos por adelantado ya que esto es diferente dependiendo de la cobertura de su seguro. Sin embargo, es posible que podamos encontrar un medicamento sustituto a Electrical engineer un formulario para que el seguro cubra el medicamento que se considera necesario.   Si se requiere una autorizacin previa para que su compaa de seguros [place] su medicamento, por favor permtanos de 1 a 2 das hbiles para completar este proceso.  Los precios de los medicamentos varan con frecuencia dependiendo del Environmental consultant de dnde se surte la receta y alguna farmacias pueden ofrecer precios ms baratos.  El sitio web www.goodrx.com tiene cupones para medicamentos de Airline pilot. Los precios aqu no tienen en cuenta lo que podra costar con la ayuda del seguro (puede ser ms barato con  su seguro), pero el sitio web puede darle el precio si no Field seismologist.  - Puede imprimir el cupn correspondiente y llevarlo con su receta a la farmacia.  - Tambin puede pasar por nuestra oficina durante el horario de atencin regular y Charity fundraiser una tarjeta de cupones de GoodRx.  - Si necesita que su receta se enve electrnicamente a una farmacia diferente, informe a nuestra oficina a travs de MyChart de [place] o por telfono llamando al [phone] y presione la opcin 4.

## 2022-09-11 ENCOUNTER — Encounter: Payer: BC Managed Care – PPO | Admitting: Internal Medicine

## 2022-10-27 ENCOUNTER — Other Ambulatory Visit (INDEPENDENT_AMBULATORY_CARE_PROVIDER_SITE_OTHER): Payer: BC Managed Care – PPO

## 2022-10-27 DIAGNOSIS — Z0000 Encounter for general adult medical examination without abnormal findings: Secondary | ICD-10-CM

## 2022-10-27 DIAGNOSIS — R7989 Other specified abnormal findings of blood chemistry: Secondary | ICD-10-CM | POA: Diagnosis not present

## 2022-10-27 DIAGNOSIS — Z13220 Encounter for screening for lipoid disorders: Secondary | ICD-10-CM | POA: Diagnosis not present

## 2022-10-27 DIAGNOSIS — E538 Deficiency of other specified B group vitamins: Secondary | ICD-10-CM

## 2022-10-27 DIAGNOSIS — R790 Abnormal level of blood mineral: Secondary | ICD-10-CM | POA: Diagnosis not present

## 2022-10-27 LAB — VITAMIN D 25 HYDROXY (VIT D DEFICIENCY, FRACTURES): VITD: 43.25 ng/mL (ref 30.00–100.00)

## 2022-10-27 LAB — LIPID PANEL
Cholesterol: 218 mg/dL — ABNORMAL HIGH (ref 0–200)
HDL: 69.8 mg/dL (ref >39.00)
LDL Cholesterol: 138 mg/dL — ABNORMAL HIGH (ref 0–99)
NonHDL: 147.89
Total CHOL/HDL Ratio: 3
Triglycerides: 51 mg/dL (ref 0.0–149.0)
VLDL: 10.2 mg/dL (ref 0.0–40.0)

## 2022-10-27 LAB — CBC WITH DIFFERENTIAL/PLATELET
Basophils Absolute: 0 10*3/uL (ref 0.0–0.1)
Basophils Relative: 0.4 % (ref 0.0–3.0)
Eosinophils Absolute: 0.1 10*3/uL (ref 0.0–0.7)
Eosinophils Relative: 1.4 % (ref 0.0–5.0)
HCT: 42.5 % (ref 36.0–46.0)
Hemoglobin: 14.3 g/dL (ref 12.0–15.0)
Lymphocytes Relative: 32.8 % (ref 12.0–46.0)
Lymphs Abs: 1.7 10*3/uL (ref 0.7–4.0)
MCHC: 33.7 g/dL (ref 30.0–36.0)
MCV: 93.8 fl (ref 78.0–100.0)
Monocytes Absolute: 0.4 10*3/uL (ref 0.1–1.0)
Monocytes Relative: 7.6 % (ref 3.0–12.0)
Neutro Abs: 3.1 10*3/uL (ref 1.4–7.7)
Neutrophils Relative %: 57.8 % (ref 43.0–77.0)
Platelets: 211 10*3/uL (ref 150.0–400.0)
RBC: 4.53 Mil/uL (ref 3.87–5.11)
RDW: 13.1 % (ref 11.5–15.5)
WBC: 5.3 10*3/uL (ref 4.0–10.5)

## 2022-10-27 LAB — COMPREHENSIVE METABOLIC PANEL
ALT: 14 U/L (ref 0–35)
AST: 15 U/L (ref 0–37)
Albumin: 4.5 g/dL (ref 3.5–5.2)
Alkaline Phosphatase: 60 U/L (ref 39–117)
BUN: 14 mg/dL (ref 6–23)
CO2: 25 mEq/L (ref 19–32)
Calcium: 9.4 mg/dL (ref 8.4–10.5)
Chloride: 104 mEq/L (ref 96–112)
Creatinine, Ser: 0.93 mg/dL (ref 0.40–1.20)
GFR: 69.63 mL/min (ref >60.00)
Glucose, Bld: 91 mg/dL (ref 70–99)
Potassium: 3.9 mEq/L (ref 3.5–5.1)
Sodium: 141 mEq/L (ref 135–145)
Total Bilirubin: 0.4 mg/dL (ref 0.2–1.2)
Total Protein: 6.8 g/dL (ref 6.0–8.3)

## 2022-10-27 LAB — MAGNESIUM: Magnesium: 2 mg/dL (ref 1.5–2.5)

## 2022-10-27 LAB — VITAMIN B12: Vitamin B-12: 506 pg/mL (ref 211–911)

## 2022-10-27 LAB — HEMOGLOBIN A1C: Hgb A1c MFr Bld: 5.3 % (ref 4.6–6.5)

## 2022-11-04 ENCOUNTER — Encounter: Payer: Self-pay | Admitting: Family Medicine

## 2022-11-04 ENCOUNTER — Ambulatory Visit (INDEPENDENT_AMBULATORY_CARE_PROVIDER_SITE_OTHER): Payer: BC Managed Care – PPO | Admitting: Family Medicine

## 2022-11-04 VITALS — BP 110/70 | HR 81 | Temp 98.4°F | Ht 69.0 in | Wt 150.6 lb

## 2022-11-04 DIAGNOSIS — Z0000 Encounter for general adult medical examination without abnormal findings: Secondary | ICD-10-CM | POA: Diagnosis not present

## 2022-11-04 NOTE — Patient Instructions (Addendum)
It was a pleasure meeting you today. Thank you for allowing me to take part in your health care.  Our goals for today as we discussed include:  Mammogram is due 9/24 Colonoscopy due in 2030 Tetanus booster due 2025  Cholesterol was elevated. LDL elevated Monitor diet and increase activity.  Follow-up 1 year next annual Schedule lab appointment 1 week prior to annual visit.  Please fast 10 hours before blood work.   If you have any questions or concerns, please do not hesitate to call the office at [phone].  I look forward to our next visit and until then take care and stay safe.  Regards,   Carollee Leitz, MD   [facility]

## 2022-11-04 NOTE — Progress Notes (Signed)
   SUBJECTIVE:   Chief Complaint  Patient presents with   Annual Exam   HPI Patient presents to clinic for annual visit.  Declines Pap smear.  No acute concerns today.  Recent lab values reviewed.  Significant for mildly elevated LDL at 138, cholesterol total elevated at 218.  No significant risk factors.  10-year ASCVD low.   Last Pap 08/22.  NILM, HPV negative.  Follows with OB/GYN.  History of abnormal mammogram.  Follow-up ultrasound benign.  Recommend yearly bilateral screening.  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  Constitutional:      General: She is not in acute distress.    Appearance: She is not ill-appearing.  HENT:     Head: Normocephalic.     Nose: Nose normal.  Eyes:     Conjunctiva/sclera: Conjunctivae normal.  Neck:     Thyroid: No thyromegaly or thyroid tenderness.  Cardiovascular:     Rate and Rhythm: Normal rate and regular rhythm.     Heart sounds: Normal heart sounds.  Pulmonary:     Effort: Pulmonary effort is normal.     Breath sounds: Normal breath sounds.  Abdominal:     General: Abdomen is flat. Bowel sounds are normal.     Palpations: Abdomen is soft.  Musculoskeletal:        General: Normal range of motion.     Cervical back: Normal range of motion.  Neurological:     Mental Status: She is alert and oriented to person, place, and time. Mental status is at baseline.  Psychiatric:        Mood and Affect: Mood normal.        Behavior: Behavior normal.        Thought Content: Thought content normal.        Judgment: Judgment normal.     ASSESSMENT/PLAN:  Annual physical exam Assessment & Plan: Healthy 57 yr old female Mammogram due  9/24 Colonoscopy UTD Last PAP 08/22.  NILM, HPV negative.  Follows with OB/GYN.  Due 2025 Tdap UTD.  Due 2025 COVID, Flu and Pneumonia vaccines UTD Lipid screening completed Hypertension screening      Total of 30 minutes spent with patient, greater than 50% of time spent face to face on counseling and coordination of care, specifically discussion concerning nutrition, LDL and Calcium score imaging.     PDMP reviewed  Return for annual visit with fasting labs 1 week prior.  Carollee Leitz, MD

## 2022-11-07 ENCOUNTER — Encounter: Payer: Self-pay | Admitting: Family Medicine

## 2022-11-07 NOTE — Assessment & Plan Note (Signed)
Healthy 57 yr old female Mammogram due 9/24 Colonoscopy UTD Last PAP 08/22.  NILM, HPV negative.  Follows with OB/GYN.  Due 2025 Tdap UTD.  Due 2025 COVID, Flu and Pneumonia vaccines UTD Lipid screening completed Hypertension screening

## 2023-02-09 ENCOUNTER — Ambulatory Visit: Payer: BC Managed Care – PPO | Admitting: Dermatology

## 2023-02-09 VITALS — BP 117/61 | HR 66

## 2023-02-09 DIAGNOSIS — Z7189 Other specified counseling: Secondary | ICD-10-CM

## 2023-02-09 DIAGNOSIS — D2271 Melanocytic nevi of right lower limb, including hip: Secondary | ICD-10-CM

## 2023-02-09 DIAGNOSIS — Z1283 Encounter for screening for malignant neoplasm of skin: Secondary | ICD-10-CM

## 2023-02-09 DIAGNOSIS — L719 Rosacea, unspecified: Secondary | ICD-10-CM | POA: Diagnosis not present

## 2023-02-09 DIAGNOSIS — D2361 Other benign neoplasm of skin of right upper limb, including shoulder: Secondary | ICD-10-CM

## 2023-02-09 DIAGNOSIS — L570 Actinic keratosis: Secondary | ICD-10-CM

## 2023-02-09 DIAGNOSIS — Z85828 Personal history of other malignant neoplasm of skin: Secondary | ICD-10-CM

## 2023-02-09 DIAGNOSIS — Z86018 Personal history of other benign neoplasm: Secondary | ICD-10-CM

## 2023-02-09 DIAGNOSIS — L814 Other melanin hyperpigmentation: Secondary | ICD-10-CM

## 2023-02-09 DIAGNOSIS — Z872 Personal history of diseases of the skin and subcutaneous tissue: Secondary | ICD-10-CM

## 2023-02-09 DIAGNOSIS — L821 Other seborrheic keratosis: Secondary | ICD-10-CM

## 2023-02-09 DIAGNOSIS — D2371 Other benign neoplasm of skin of right lower limb, including hip: Secondary | ICD-10-CM

## 2023-02-09 DIAGNOSIS — D2372 Other benign neoplasm of skin of left lower limb, including hip: Secondary | ICD-10-CM

## 2023-02-09 DIAGNOSIS — L578 Other skin changes due to chronic exposure to nonionizing radiation: Secondary | ICD-10-CM

## 2023-02-09 DIAGNOSIS — X32XXXA Exposure to sunlight, initial encounter: Secondary | ICD-10-CM

## 2023-02-09 DIAGNOSIS — D229 Melanocytic nevi, unspecified: Secondary | ICD-10-CM

## 2023-02-09 DIAGNOSIS — L820 Inflamed seborrheic keratosis: Secondary | ICD-10-CM | POA: Diagnosis not present

## 2023-02-09 DIAGNOSIS — D225 Melanocytic nevi of trunk: Secondary | ICD-10-CM

## 2023-02-09 DIAGNOSIS — W908XXA Exposure to other nonionizing radiation, initial encounter: Secondary | ICD-10-CM

## 2023-02-09 DIAGNOSIS — D239 Other benign neoplasm of skin, unspecified: Secondary | ICD-10-CM

## 2023-02-09 NOTE — Patient Instructions (Addendum)
Actinic keratoses are precancerous spots that appear secondary to cumulative UV radiation exposure/sun exposure over time. They are chronic with expected duration over 1 year. A portion of actinic keratoses will progress to squamous cell carcinoma of the skin. It is not possible to reliably predict which spots will progress to skin cancer and so treatment is recommended to prevent development of skin cancer.  Recommend daily broad spectrum sunscreen SPF 30+ to sun-exposed areas, reapply every 2 hours as needed.  Recommend staying in the shade or wearing long sleeves, sun glasses (UVA+UVB protection) and wide brim hats (4-inch brim around the entire circumference of the hat). Call for new or changing lesions.   Cryotherapy Aftercare  Wash gently with soap and water everyday.   Apply Vaseline and Band-Aid daily until healed.    Seborrheic Keratosis  What causes seborrheic keratoses? Seborrheic keratoses are harmless, common skin growths that first appear during adult life.  As time goes by, more growths appear.  Some people may develop a large number of them.  Seborrheic keratoses appear on both covered and uncovered body parts.  They are not caused by sunlight.  The tendency to develop seborrheic keratoses can be inherited.  They vary in color from skin-colored to gray, brown, or even black.  They can be either smooth or have a rough, warty surface.   Seborrheic keratoses are superficial and look as if they were stuck on the skin.  Under the microscope this type of keratosis looks like layers upon layers of skin.  That is why at times the top layer may seem to fall off, but the rest of the growth remains and re-grows.    Treatment Seborrheic keratoses do not need to be treated, but can easily be removed in the office.  Seborrheic keratoses often cause symptoms when they rub on clothing or jewelry.  Lesions can be in the way of shaving.  If they become inflamed, they can cause itching, soreness, or  burning.  Removal of a seborrheic keratosis can be accomplished by freezing, burning, or surgery. If any spot bleeds, scabs, or grows rapidly, please return to have it checked, as these can be an indication of a skin cancer.  Melanoma ABCDEs  Melanoma is the most dangerous type of skin cancer, and is the leading cause of death from skin disease.  You are more likely to develop melanoma if you: Have light-colored skin, light-colored eyes, or red or blond hair Spend a lot of time in the sun Tan regularly, either outdoors or in a tanning bed Have had blistering sunburns, especially during childhood Have a close family member who has had a melanoma Have atypical moles or large birthmarks  Early detection of melanoma is key since treatment is typically straightforward and cure rates are extremely high if we catch it early.   The first sign of melanoma is often a change in a mole or a new dark spot.  The ABCDE system is a way of remembering the signs of melanoma.  A for asymmetry:  The two halves do not match. B for border:  The edges of the growth are irregular. C for color:  A mixture of colors are present instead of an even brown color. D for diameter:  Melanomas are usually (but not always) greater than 6mm - the size of a pencil eraser. E for evolution:  The spot keeps changing in size, shape, and color.  Please check your skin once per month between visits. You can use a small   mirror in front and a large mirror behind you to keep an eye on the back side or your body.   If you see any new or changing lesions before your next follow-up, please call to schedule a visit.  Please continue daily skin protection including broad spectrum sunscreen SPF 30+ to sun-exposed areas, reapplying every 2 hours as needed when you're outdoors.   Staying in the shade or wearing long sleeves, sun glasses (UVA+UVB protection) and wide brim hats (4-inch brim around the entire circumference of the hat) are also  recommended for sun protection.    Due to recent changes in healthcare laws, you may see results of your pathology and/or laboratory studies on MyChart before the doctors have had a chance to review them. We understand that in some cases there may be results that are confusing or concerning to you. Please understand that not all results are received at the same time and often the doctors may need to interpret multiple results in order to provide you with the best plan of care or course of treatment. Therefore, we ask that you please give us 2 business days to thoroughly review all your results before contacting the office for clarification. Should we see a critical lab result, you will be contacted sooner.   If You Need Anything After Your Visit  If you have any questions or concerns for your doctor, please call our main line at [phone] and press option 4 to reach your doctor's medical assistant. If no one answers, please leave a voicemail as directed and we will return your call as soon as possible. Messages left after 4 pm will be answered the following business day.   You may also send us a message via MyChart. We typically respond to MyChart messages within 1-2 business days.  For prescription refills, please ask your pharmacy to contact our office. Our fax number is [phone].  If you have an urgent issue when the clinic is closed that cannot wait until the next business day, you can page your doctor at the number below.    Please note that while we do our best to be available for urgent issues outside of office hours, we are not available 24/7.   If you have an urgent issue and are unable to reach us, you may choose to seek medical care at your doctor's office, retail clinic, urgent care center, or emergency room.  If you have a medical emergency, please immediately call 911 or go to the emergency department.  Pager Numbers  - Dr. Kowalski: [phone]  - Dr. Moye:  [phone]  - Dr. Stewart: [phone]  In the event of inclement weather, please call our main line at [phone] for an update on the status of any delays or closures.  Dermatology Medication Tips: Please keep the boxes that topical medications come in in order to help keep track of the instructions about where and how to use these. Pharmacies typically print the medication instructions only on the boxes and not directly on the medication tubes.   If your medication is too expensive, please contact our office at [phone] option 4 or send us a message through MyChart.   We are unable to tell what your co-pay for medications will be in advance as this is different depending on your insurance coverage. However, we may be able to find a substitute medication at lower cost or fill out paperwork to get insurance to cover a needed medication.   If a prior authorization   is required to get your medication covered by your insurance company, please allow us 1-2 business days to complete this process.  Drug prices often vary depending on where the prescription is filled and some pharmacies may offer cheaper prices.  The website www.goodrx.com contains coupons for medications through different pharmacies. The prices here do not account for what the cost may be with help from insurance (it may be cheaper with your insurance), but the website can give you the price if you did not use any insurance.  - You can print the associated coupon and take it with your prescription to the pharmacy.  - You may also stop by our office during regular business hours and pick up a GoodRx coupon card.  - If you need your prescription sent electronically to a different pharmacy, notify our office through [facility] MyChart or by phone at [phone] option 4.     Si Usted Necesita Algo Despus de Su Visita  Tambin puede enviarnos un mensaje a travs de MyChart. Por lo general respondemos a los mensajes de  MyChart en el transcurso de 1 a 2 das hbiles.  Para renovar recetas, por favor pida a su farmacia que se ponga en contacto con nuestra oficina. Nuestro nmero de fax es el [phone].  Si tiene un asunto urgente cuando la clnica est cerrada y que no puede esperar hasta el siguiente da hbil, puede llamar/localizar a su doctor(a) al nmero que aparece a continuacin.   Por favor, tenga en cuenta que aunque hacemos todo lo posible para estar disponibles para asuntos urgentes fuera del horario de oficina, no estamos disponibles las 24 horas del da, los 7 das de la semana.   Si tiene un problema urgente y no puede comunicarse con nosotros, puede optar por buscar atencin mdica  en el consultorio de su doctor(a), en una clnica privada, en un centro de atencin urgente o en una sala de emergencias.  Si tiene una emergencia mdica, por favor llame inmediatamente al 911 o vaya a la sala de emergencias.  Nmeros de bper  - Dr. Kowalski: [phone]  - Dra. Moye: [phone]  - Dra. Stewart: [phone]  En caso de inclemencias del tiempo, por favor llame a nuestra lnea principal al [phone] para una actualizacin sobre el estado de cualquier retraso o cierre.  Consejos para la medicacin en dermatologa: Por favor, guarde las cajas en las que vienen los medicamentos de uso tpico para ayudarle a seguir las instrucciones sobre dnde y cmo usarlos. Las farmacias generalmente imprimen las instrucciones del medicamento slo en las cajas y no directamente en los tubos del medicamento.   Si su medicamento es muy caro, por favor, pngase en contacto con nuestra oficina llamando al [phone] y presione la opcin 4 o envenos un mensaje a travs de MyChart.   No podemos decirle cul ser su copago por los medicamentos por adelantado ya que esto es diferente dependiendo de la cobertura de su seguro. Sin embargo, es posible que podamos encontrar un medicamento sustituto a menor costo o  llenar un formulario para que el seguro cubra el medicamento que se considera necesario.   Si se requiere una autorizacin previa para que su compaa de seguros cubra su medicamento, por favor permtanos de 1 a 2 das hbiles para completar este proceso.  Los precios de los medicamentos varan con frecuencia dependiendo del lugar de dnde se surte la receta y alguna farmacias pueden ofrecer precios ms baratos.  El sitio web www.goodrx.com tiene cupones para medicamentos   de diferentes farmacias. Los precios aqu no tienen en cuenta lo que podra costar con la ayuda del seguro (puede ser ms barato con su seguro), pero el sitio web puede darle el precio si no utiliz ningn seguro.  - Puede imprimir el cupn correspondiente y llevarlo con su receta a la farmacia.  - Tambin puede pasar por nuestra oficina durante el horario de atencin regular y recoger una tarjeta de cupones de GoodRx.  - Si necesita que su receta se enve electrnicamente a una farmacia diferente, informe a nuestra oficina a travs de MyChart de [facility] o por telfono llamando al [phone] y presione la opcin 4.  

## 2023-02-09 NOTE — Progress Notes (Signed)
Follow-Up Visit   Subjective  Kara Spencer is a 57 y.o. female who presents for the following: Skin Cancer Screening and Full Body Skin Exam 6 month tbse, hx of bcc, hx of dysplastic , hx of aks, Scaly spots at eyebrows that come and go   The patient presents for Total-Body Skin Exam (TBSE) for skin cancer screening and mole check. The patient has spots, moles and lesions to be evaluated, some may be new or changing and the patient has concerns that these could be cancer.    The following portions of the chart were reviewed this encounter and updated as appropriate: medications, allergies, medical history  Review of Systems:  No other skin or systemic complaints except as noted in HPI or Assessment and Plan.  Objective  Well appearing patient in no apparent distress; mood and affect are within normal limits.  A full examination was performed including scalp, head, eyes, ears, nose, lips, neck, chest, axillae, abdomen, back, buttocks, bilateral upper extremities, bilateral lower extremities, hands, feet, fingers, toes, fingernails, and toenails. All findings within normal limits unless otherwise noted below.   Relevant physical exam findings are noted in the Assessment and Plan.  right pretibia x 1, left upper pretibia x 1, left eyebrow x 1 (3) Pink/tan macule/papules  right eyebrow upper edge x 2 (2) Pink scaly macules     Assessment & Plan   LENTIGINES, SEBORRHEIC KERATOSES, HEMANGIOMAS - Benign normal skin lesions - Benign-appearing - Call for any changes Sk at left upper eyebrow  MELANOCYTIC NEVI - Tan-brown and/or pink-flesh-colored symmetric macules and papules - Benign appearing on exam today - Observation - Call clinic for new or changing moles - Recommend daily use of broad spectrum spf 30+ sunscreen to sun-exposed areas.  Nevus R lat buttock - 7.0 x 3.79mm med brown macule appears as 2 adjacent   R lower hip/upper thigh - 3.73mm med brown macule   R upper  pretibia - 2.79mm med brown macule   Spinal upper back - 5.36mm speckled tan macule with darker medial 1.30mm macule, adjacent to bx site   Superior umbilicus - 4.60mm brown macule   Baseline photos of abdomen compared- no changes  Benign-appearing. Stable compared to previous visit. Observation.  Call clinic for new or changing moles.  Recommend daily use of broad spectrum spf 30+ sunscreen to sun-exposed areas.    Dermatofibroma - Firm pink/brown papulenodule with dimple sign - Benign appearing - Call for any changes  - L post ankle, R ant thigh, L ant thigh, R forearm  Rosacea Face  Exam: mild erythema with telangiectasia at alar crease   Chronic and persistent condition with duration or expected duration over one year. Condition is symptomatic/ bothersome to patient. Not currently at goal.  Improved with one BBL treatment, but still has residual telangiectasias   Rosacea is a chronic progressive skin condition usually affecting the face of adults, causing redness and/or acne bumps. It is treatable but not curable. It sometimes affects the eyes (ocular rosacea) as well. It may respond to topical and/or systemic medication and can flare with stress, sun exposure, alcohol, exercise, topical steroids (including hydrocortisone/cortisone 10) and some foods.  Daily application of broad spectrum spf 30+ sunscreen to face is recommended to reduce flares.   Discussed the treatment option of BBL/laser.  Typically we recommend 1-3 treatment sessions about 5-8 weeks apart for best results.  The patient's condition may require "maintenance treatments" in the future.  The fee for BBL / laser treatments  is $350 per treatment session for the whole face.  A fee can be quoted for other parts of the body. Insurance typically does not pay for BBL/laser treatments and therefore the fee is an out-of-pocket cost.    ACTINIC DAMAGE WITH PRECANCEROUS ACTINIC KERATOSES Counseling for Topical Chemotherapy  Management: Patient exhibits: - Severe, confluent actinic changes with pre-cancerous actinic keratoses that is secondary to cumulative UV radiation exposure over time - Condition that is severe; chronic, not at goal. - diffuse scaly erythematous macules and papules with underlying dyspigmentation - Discussed Prescription "Field Treatment" topical Chemotherapy for Severe, Chronic Confluent Actinic Changes with Pre-Cancerous Actinic Keratoses Field treatment involves treatment of an entire area of skin that has confluent Actinic Changes (Sun/ Ultraviolet light damage) and PreCancerous Actinic Keratoses by method of PhotoDynamic Therapy (PDT) and/or prescription Topical Chemotherapy agents such as 5-fluorouracil, 5-fluorouracil/calcipotriene, and/or imiquimod.  The purpose is to decrease the number of clinically evident and subclinical PreCancerous lesions to prevent progression to development of skin cancer by chemically destroying early precancer changes that may or may not be visible.  It has been shown to reduce the risk of developing skin cancer in the treated area. As a result of treatment, redness, scaling, crusting, and open sores may occur during treatment course. One or more than one of these methods may be used and may have to be used several times to control, suppress and eliminate the PreCancerous changes. Discussed treatment course, expected reaction, and possible side effects. - Recommend daily broad spectrum sunscreen SPF 30+ to sun-exposed areas, reapply every 2 hours as needed.  - Staying in the shade or wearing long sleeves, sun glasses (UVA+UVB protection) and wide brim hats (4-inch brim around the entire circumference of the hat) are also recommended. - Call for new or changing lesions.  Recommend fall/winter - Will schedule red light photodynamic therapy to the face with debridement    HISTORY OF BASAL CELL CARCINOMA OF THE SKIN Multiple locations see history  R medial shoulder   - No evidence of recurrence today - Recommend regular full body skin exams - Recommend daily broad spectrum sunscreen SPF 30+ to sun-exposed areas, reapply every 2 hours as needed.  - Call if any new or changing lesions are noted between office visits  HISTORY OF DYSPLASTIC NEVUS Multiple locations see history L upper abdomen, L post shoulder, L sternum, R spinal lower back, L upper abdomen, L spinal upper back   No evidence of recurrence today Recommend regular full body skin exams Recommend daily broad spectrum sunscreen SPF 30+ to sun-exposed areas, reapply every 2 hours as needed.  Call if any new or changing lesions are noted between office visits  SKIN CANCER SCREENING PERFORMED TODAY.  Inflamed seborrheic keratosis (3) right pretibia x 1, left upper pretibia x 1, left eyebrow x 1  Vs aks at right and left upper pretibia   Symptomatic, irritating, patient would like treated.  Destruction of lesion - right pretibia x 1, left upper pretibia x 1, left eyebrow x 1  Destruction method: cryotherapy   Informed consent: discussed and consent obtained   Lesion destroyed using liquid nitrogen: Yes   Region frozen until ice ball extended beyond lesion: Yes   Outcome: patient tolerated procedure well with no complications   Post-procedure details: wound care instructions given   Additional details:  Prior to procedure, discussed risks of blister formation, small wound, skin dyspigmentation, or rare scar following cryotherapy. Recommend Vaseline ointment to treated areas while healing.   Actinic keratosis (  2) right eyebrow upper edge x 2  Actinic keratoses are precancerous spots that appear secondary to cumulative UV radiation exposure/sun exposure over time. They are chronic with expected duration over 1 year. A portion of actinic keratoses will progress to squamous cell carcinoma of the skin. It is not possible to reliably predict which spots will progress to skin cancer and so  treatment is recommended to prevent development of skin cancer.  Recommend daily broad spectrum sunscreen SPF 30+ to sun-exposed areas, reapply every 2 hours as needed.  Recommend staying in the shade or wearing long sleeves, sun glasses (UVA+UVB protection) and wide brim hats (4-inch brim around the entire circumference of the hat). Call for new or changing lesions.  Destruction of lesion - right eyebrow upper edge x 2  Destruction method: cryotherapy   Informed consent: discussed and consent obtained   Lesion destroyed using liquid nitrogen: Yes   Region frozen until ice ball extended beyond lesion: Yes   Outcome: patient tolerated procedure well with no complications   Post-procedure details: wound care instructions given   Additional details:  Prior to procedure, discussed risks of blister formation, small wound, skin dyspigmentation, or rare scar following cryotherapy. Recommend Vaseline ointment to treated areas while healing.    Return in about 6 months (around 08/12/2023) for TBSE with Red light face.  I, Asher Muir, CMA, am acting as scribe for Willeen Niece, MD.   Documentation: I have reviewed the above documentation for accuracy and completeness, and I agree with the above.  Willeen Niece, MD

## 2023-07-09 ENCOUNTER — Other Ambulatory Visit: Payer: Self-pay | Admitting: Obstetrics and Gynecology

## 2023-07-09 DIAGNOSIS — Z1231 Encounter for screening mammogram for malignant neoplasm of breast: Secondary | ICD-10-CM

## 2023-07-12 ENCOUNTER — Ambulatory Visit
Admission: RE | Admit: 2023-07-12 | Discharge: 2023-07-12 | Disposition: A | Discharge status: Home / Self Care | Payer: BC Managed Care – PPO | Source: Ambulatory Visit | Attending: Obstetrics and Gynecology | Admitting: Obstetrics and Gynecology

## 2023-07-12 DIAGNOSIS — Z1231 Encounter for screening mammogram for malignant neoplasm of breast: Secondary | ICD-10-CM | POA: Insufficient documentation

## 2023-08-03 ENCOUNTER — Ambulatory Visit: Payer: BC Managed Care – PPO | Admitting: Dermatology

## 2023-08-03 ENCOUNTER — Ambulatory Visit: Payer: BC Managed Care – PPO

## 2023-08-03 DIAGNOSIS — Z86018 Personal history of other benign neoplasm: Secondary | ICD-10-CM

## 2023-08-03 DIAGNOSIS — Z85828 Personal history of other malignant neoplasm of skin: Secondary | ICD-10-CM

## 2023-08-03 DIAGNOSIS — R238 Other skin changes: Secondary | ICD-10-CM

## 2023-08-03 DIAGNOSIS — L603 Nail dystrophy: Secondary | ICD-10-CM

## 2023-08-03 DIAGNOSIS — Z1283 Encounter for screening for malignant neoplasm of skin: Secondary | ICD-10-CM | POA: Diagnosis not present

## 2023-08-03 DIAGNOSIS — D2271 Melanocytic nevi of right lower limb, including hip: Secondary | ICD-10-CM

## 2023-08-03 DIAGNOSIS — D2361 Other benign neoplasm of skin of right upper limb, including shoulder: Secondary | ICD-10-CM | POA: Diagnosis not present

## 2023-08-03 DIAGNOSIS — L578 Other skin changes due to chronic exposure to nonionizing radiation: Secondary | ICD-10-CM

## 2023-08-03 DIAGNOSIS — D2372 Other benign neoplasm of skin of left lower limb, including hip: Secondary | ICD-10-CM

## 2023-08-03 DIAGNOSIS — D1801 Hemangioma of skin and subcutaneous tissue: Secondary | ICD-10-CM

## 2023-08-03 DIAGNOSIS — L821 Other seborrheic keratosis: Secondary | ICD-10-CM

## 2023-08-03 DIAGNOSIS — Z872 Personal history of diseases of the skin and subcutaneous tissue: Secondary | ICD-10-CM

## 2023-08-03 DIAGNOSIS — W908XXA Exposure to other nonionizing radiation, initial encounter: Secondary | ICD-10-CM

## 2023-08-03 DIAGNOSIS — D2371 Other benign neoplasm of skin of right lower limb, including hip: Secondary | ICD-10-CM

## 2023-08-03 DIAGNOSIS — L570 Actinic keratosis: Secondary | ICD-10-CM | POA: Diagnosis not present

## 2023-08-03 DIAGNOSIS — D239 Other benign neoplasm of skin, unspecified: Secondary | ICD-10-CM

## 2023-08-03 DIAGNOSIS — L814 Other melanin hyperpigmentation: Secondary | ICD-10-CM

## 2023-08-03 DIAGNOSIS — D225 Melanocytic nevi of trunk: Secondary | ICD-10-CM

## 2023-08-03 DIAGNOSIS — D229 Melanocytic nevi, unspecified: Secondary | ICD-10-CM

## 2023-08-03 MED ORDER — AMINOLEVULINIC ACID HCL 10 % EX GEL
2000.0000 mg | Freq: Once | CUTANEOUS | Status: AC
Start: 2023-08-03 — End: 2023-08-03
  Administered 2023-08-03: 2000 mg via TOPICAL

## 2023-08-03 NOTE — Patient Instructions (Addendum)
Recommend applying OTC DermaNail conditioner to cuticle area of fingernails twice daily to help with chipping/breaking.  May take 3-6 months of regular use to get best result.  Could also add OTC Biotin supplement 2.5 mg daily to help strengthen nails.  Recommend mild soap with handwashing followed by a thick moisturizing cream to fingernails.  May use Cutemol cream,which comes with DermaNail, Neutrogena [place] hand cream, or Eucerin cream original.    Levulan/PDT Treatment Common Side Effects  - Burning/stinging, which may be severe and last up to 24-72 hours after your treatment  - Redness, swelling and/or peeling which may last up to 4 weeks  - Scaling/crusting which may last up to 2 weeks  - Sun sensitivity (you MUST avoid sun exposure for 48-72 hours after treatment)  Care Instructions  - Okay to wash with soap and water and shampoo as normal  - If needed, you can do a cold compress (ex. Ice packs) for comfort  - If okay with your Primary Doctor, you may use analgesics such as Tylenol every 4-6 hours, not to exceed recommended dose  - You may apply Cerave Healing Ointment, Vaseline or Aquaphor  - If you have a lot of swelling you may take a Benadryl to help with this (this may cause drowsiness)  Sun Precautions  - Wear a wide brim hat for the next week if outside  - Wear a sunblock with zinc or titanium dioxide at least SPF 50 daily   We will recheck you in 10-12 weeks. If any problems, please call the office and ask to speak with a nurse.   Melanoma ABCDEs  Melanoma is the most dangerous type of skin cancer, and is the leading cause of death from skin disease.  You are more likely to develop melanoma if you: Have light-colored skin, light-colored eyes, or red or blond hair Spend a lot of time in the sun Tan regularly, either outdoors or in a tanning bed Have had blistering sunburns, especially during childhood Have a close family member who has had a melanoma Have  atypical moles or large birthmarks  Early detection of melanoma is key since treatment is typically straightforward and cure rates are extremely high if we catch it early.   The first sign of melanoma is often a change in a mole or a new dark spot.  The ABCDE system is a way of remembering the signs of melanoma.  A for asymmetry:  The two halves do not match. B for border:  The edges of the growth are irregular. C for color:  A mixture of colors are present instead of an even brown color. D for diameter:  Melanomas are usually (but not always) greater than 6mm - the size of a pencil eraser. E for evolution:  The spot keeps changing in size, shape, and color.  Please check your skin once per month between visits. You can use a small mirror in front and a large mirror behind you to keep an eye on the back side or your body.   If you see any new or changing lesions before your next follow-up, please call to schedule a visit.  Please continue daily skin protection including broad spectrum sunscreen SPF 30+ to sun-exposed areas, reapplying every 2 hours as needed when you're outdoors.   Staying in the shade or wearing long sleeves, sun glasses (UVA+UVB protection) and wide brim hats (4-inch brim around the entire circumference of the hat) are also recommended for sun protection.  Due to recent changes in healthcare laws, you may see results of your pathology and/or laboratory studies on MyChart before the doctors have had a chance to review them. We understand that in some cases there may be results that are confusing or concerning to you. Please understand that not all results are received at the same time and often the doctors may need to interpret multiple results in order to provide you with the best plan of care or course of treatment. Therefore, we ask that you please give [place] 2 business days to thoroughly review all your results before contacting the office for clarification. Should we see a  critical lab result, you will be contacted sooner.   If You Need Anything After Your Visit  If you have any questions or concerns for your doctor, please call our main line at [phone] and press option 4 to reach your doctor's medical assistant. If no one answers, please leave a voicemail as directed and we will return your call as soon as possible. Messages left after 4 pm will be answered the following business day.   You may also send [place] a message via MyChart. We typically respond to MyChart messages within 1-2 business days.  For prescription refills, please ask your pharmacy to contact our office. Our fax number is [phone].  If you have an urgent issue when the clinic is closed that cannot wait until the next business day, you can page your doctor at the number below.    Please note that while we do our best to be available for urgent issues outside of office hours, we are not available 24/7.   If you have an urgent issue and are unable to reach [place], you may choose to seek medical care at your doctor's office, retail clinic, urgent care center, or emergency room.  If you have a medical emergency, please immediately call 911 or go to the emergency department.  Pager Numbers  - Dr. Gwen Pounds: [phone]  - Dr. Roseanne Reno: [phone]  - Dr. Katrinka Blazing: [phone]   In the event of inclement weather, please call our main line at [phone] for an update on the status of any delays or closures.  Dermatology Medication Tips: Please keep the boxes that topical medications come in in order to help keep track of the instructions about where and how to use these. Pharmacies typically print the medication instructions only on the boxes and not directly on the medication tubes.   If your medication is too expensive, please contact our office at [phone] option 4 or send [place] a message through MyChart.   We are unable to tell what your co-pay for medications will be in advance as  this is different depending on your insurance coverage. However, we may be able to find a substitute medication at lower cost or fill out paperwork to get insurance to cover a needed medication.   If a prior authorization is required to get your medication covered by your insurance company, please allow [place] 1-2 business days to complete this process.  Drug prices often vary depending on where the prescription is filled and some pharmacies may offer cheaper prices.  The website www.goodrx.com contains coupons for medications through different pharmacies. The prices here do not account for what the cost may be with help from insurance (it may be cheaper with your insurance), but the website can give you the price if you did not use any insurance.  - You can print the associated coupon and take  it with your prescription to the pharmacy.  - You may also stop by our office during regular business hours and pick up a GoodRx coupon card.  - If you need your prescription sent electronically to a different pharmacy, notify our office through [facility] or by phone at [phone] option 4.     Si Usted Necesita Algo Despus de Su Visita  Tambin puede enviarnos un mensaje a travs de Clinical cytogeneticist. Por lo general respondemos a los mensajes de MyChart en el transcurso de 1 a 2 das hbiles.  Para renovar recetas, por favor pida a su farmacia que se ponga en contacto con nuestra oficina. Annie Sable de fax es [place] [phone].  Si tiene un asunto urgente cuando la clnica est cerrada y que no puede esperar hasta el siguiente da hbil, puede llamar/localizar a su doctor(a) al nmero que aparece a continuacin.   Por favor, tenga en cuenta que aunque hacemos todo lo posible para estar disponibles para asuntos urgentes fuera del horario de [place], no estamos disponibles las 24 horas del da, los 7 [address]  [address] de la [place].   Si tiene un problema urgente y no puede comunicarse con nosotros, puede optar por  buscar atencin mdica  en el consultorio de su doctor(a), en una clnica privada, en un centro de atencin urgente o en una sala de emergencias.  Si tiene Engineer, drilling, por favor llame inmediatamente al 911 o vaya a la sala de emergencias.  Nmeros de bper  - Dr. Gwen Pounds: [phone]  - Dra. Roseanne Reno: [phone]  - Dr. Katrinka Blazing: [phone]   En caso de inclemencias del tiempo, por favor llame a Lacy Duverney principal al [phone] para una actualizacin sobre el [place] de cualquier retraso o cierre.  Consejos para la medicacin en dermatologa: Por favor, guarde las cajas en las que vienen los medicamentos de uso tpico para ayudarle a seguir las instrucciones sobre dnde y cmo usarlos. Las farmacias generalmente imprimen las instrucciones del medicamento slo en las cajas y no directamente en los tubos del [place].   Si su medicamento es muy caro, por favor, pngase en contacto con Rolm Gala llamando al [phone] y presione la opcin 4 o envenos un mensaje a travs de Clinical cytogeneticist.   No podemos decirle cul ser su copago por los medicamentos por adelantado ya que esto es diferente dependiendo de la cobertura de su seguro. Sin embargo, es posible que podamos encontrar un medicamento sustituto a Audiological scientist un formulario para que el seguro cubra el medicamento que se considera necesario.   Si se requiere una autorizacin previa para que su compaa de seguros [place] su medicamento, por favor permtanos de 1 a 2 das hbiles para completar [address]  Los precios de los medicamentos varan con frecuencia dependiendo del Environmental consultant de dnde se surte la receta y alguna farmacias pueden ofrecer precios ms baratos.  El sitio web www.goodrx.com tiene cupones para medicamentos de Health and safety inspector. Los precios aqu no tienen en cuenta lo que podra costar con la ayuda del seguro (puede ser ms barato con su seguro), pero el sitio web puede darle el precio si no  utiliz Tourist information centre manager.  - Puede imprimir el cupn correspondiente y llevarlo con su receta a la farmacia.  - Tambin puede pasar por nuestra oficina durante el horario de atencin regular y Education officer, museum una tarjeta de cupones de GoodRx.  - Si necesita que su receta se enve electrnicamente a Psychiatrist, informe a nuestra oficina a travs de MyChart  de [facility] o por telfono llamando al [phone] y presione la opcin 4.

## 2023-08-03 NOTE — Progress Notes (Signed)
Follow-Up Visit   Subjective  Kara Spencer is a 57 y.o. female who presents for the following: Skin Cancer Screening and Full Body Skin Exam  The patient presents for Total-Body Skin Exam (TBSE) for skin cancer screening and mole check. The patient has spots, moles and lesions to be evaluated, some may be new or changing. 6 month tbse, hx of bcc, hx of dysplastic , hx of aks. Patient is having red light with debridement to the face today.    The following portions of the chart were reviewed this encounter and updated as appropriate: medications, allergies, medical history  Review of Systems:  No other skin or systemic complaints except as noted in HPI or Assessment and Plan.  Objective  Well appearing patient in no apparent distress; mood and affect are within normal limits.  A full examination was performed including scalp, head, eyes, ears, nose, lips, neck, chest, axillae, abdomen, back, buttocks, bilateral upper extremities, bilateral lower extremities, hands, feet, fingers, toes, fingernails, and toenails. All findings within normal limits unless otherwise noted below.   Relevant physical exam findings are noted in the Assessment and Plan.    Assessment & Plan   SKIN CANCER SCREENING PERFORMED TODAY.  ACTINIC DAMAGE WITH PRECANCEROUS ACTINIC KERATOSES Counseling for Topical Chemotherapy Management: Patient exhibits: - Severe, confluent actinic changes with pre-cancerous actinic keratoses that is secondary to cumulative UV radiation exposure over time - Condition that is severe; chronic, not at goal. - diffuse scaly erythematous macules and papules with underlying dyspigmentation - Discussed Prescription "Field Treatment" topical Chemotherapy for Severe, Chronic Confluent Actinic Changes with Pre-Cancerous Actinic Keratoses Field treatment involves treatment of an entire area of skin that has confluent Actinic Changes (Sun/ Ultraviolet light damage) and PreCancerous Actinic  Keratoses by method of PhotoDynamic Therapy (PDT) and/or prescription Topical Chemotherapy agents such as 5-fluorouracil, 5-fluorouracil/calcipotriene, and/or imiquimod.  The purpose is to decrease the number of clinically evident and subclinical PreCancerous lesions to prevent progression to development of skin cancer by chemically destroying early precancer changes that may or may not be visible.  It has been shown to reduce the risk of developing skin cancer in the treated area. As a result of treatment, redness, scaling, crusting, and open sores may occur during treatment course. One or more than one of these methods may be used and may have to be used several times to control, suppress and eliminate the PreCancerous changes. Discussed treatment course, expected reaction, and possible side effects. - Recommend daily broad spectrum sunscreen SPF 30+ to sun-exposed areas, reapply every 2 hours as needed.  - Staying in the shade or wearing long sleeves, sun glasses (UVA+UVB protection) and wide brim hats (4-inch brim around the entire circumference of the hat) are also recommended. - Call for new or changing lesions.  Patient completed red light phototherapy to face with debridement today.  ACTINIC KERATOSES Exam: Erythematous thin papules/macules with gritty scale.  Treatment Plan:  Red Light Photodynamic therapy  Procedure discussed: discussed risks, benefits, side effects. and alternatives   Prep: site scrubbed/prepped with acetone   Debridement needed: Yes (performed by Physician with sand paper.  (CPT C5184948)) Location:  face Number of lesions:  Multiple (> 15) Type of treatment:  Red light Aminolevulinic Acid (see MAR for details): Ameluz Aminolevulinic Acid comment:  [id number] Amount of Ameluz (mg):  1 Incubation time (minutes):  60 Number of minutes under lamp:  20 Cooling:  Fan Outcome: patient tolerated procedure well with no complications   Post-procedure details: sunscreen applied  and aftercare instructions given to patient    Related Medications Aminolevulinic Acid HCl 10 % GEL 2,000 mg  Dorathy Daft, RMA  I personally debrided area prior to application of aminolevulinic acid, Willeen Niece MD  Actinic keratosis  Related Medications Aminolevulinic Acid HCl 10 % GEL 2,000 mg   LENTIGINES, SEBORRHEIC KERATOSES, HEMANGIOMAS - Benign normal skin lesions - Benign-appearing - Call for any changes  MELANOCYTIC NEVI - Tan-brown and/or pink-flesh-colored symmetric macules and papules - R lat buttock - 7.0 x 3.36mm med brown macule appears as 2 adjacent - R lower hip/upper thigh - 3.54mm med brown macule - R upper pretibia - 2.80mm med brown macule - Spinal upper back - 5.73mm speckled tan macule with darker medial 1.50mm macule, adjacent to bx site - Superior umbilicus - 4.54mm brown macule Baseline photos of abdomen and back compared from 01/30/2020- no changes - Benign appearing on exam today, stable - Observation - Call clinic for new or changing moles - Recommend daily use of broad spectrum spf 30+ sunscreen to sun-exposed areas.   Dermatofibroma - Firm pink/brown papulenodule with dimple sign - L post ankle, R ant thigh, L ant thigh, R forearm A dermatofibroma is a benign growth possibly related to trauma, such as an insect bite, cut from shaving, or inflamed acne-type bump.  Treatment options to remove include shave or excision with resulting scar and risk of recurrence.  Since not bothersome, will observe for now.   HISTORY OF BASAL CELL CARCINOMA OF THE SKIN Multiple locations see history  R medial shoulder, R neck, L mid lower back  - No evidence of recurrence today - Recommend regular full body skin exams - Recommend daily broad spectrum sunscreen SPF 30+ to sun-exposed areas, reapply every 2 hours as needed.  - Call if any new or changing lesions are noted between office visits   HISTORY OF DYSPLASTIC NEVUS Multiple locations see history L upper  abdomen, L post shoulder, L sternum, R spinal lower back, L upper abdomen, L spinal upper back  No evidence of recurrence today Recommend regular full body skin exams Recommend daily broad spectrum sunscreen SPF 30+ to sun-exposed areas, reapply every 2 hours as needed.  Call if any new or changing lesions are noted between office visits   SEBORRHEIC KERATOSIS vs FLAT WARTS - Flat flesh waxy papules at central upper forehead, upper sternum, right anterior thigh, left thenar hand - If not clear at next appointment, plan cryotherapy treatment.   NAIL DYSTROPHY Exam: Focal vertical ridging with distal notching of the fingernails.  Treatment Plan: May be permanent change due to possible scar in nail matrix Recommend applying OTC DermaNail conditioner to cuticle area of fingernails twice daily to help with chipping/breaking.  May take 3-6 months of regular use to get best result.  Could also add OTC Biotin supplement 2.5 mg daily to help strengthen nails.  Recommend mild soap with handwashing followed by a thick moisturizing cream to fingernails.  May use Cutemol cream,which comes with DermaNail, Neutrogena [place] hand cream, or Eucerin cream original.   Documentation: I have reviewed the above documentation for accuracy and completeness, and I agree with the above.  Willeen Niece, MD     Return in about 6 months (around 01/31/2024) for TBSE.  ICherlyn Labella, CMA, am acting as scribe for Willeen Niece, MD .   Documentation: I have reviewed the above documentation for accuracy and completeness, and I agree with the above.  Willeen Niece, MD

## 2023-10-28 ENCOUNTER — Other Ambulatory Visit: Payer: Self-pay | Admitting: Family Medicine

## 2023-10-28 ENCOUNTER — Telehealth: Payer: Self-pay | Admitting: Family Medicine

## 2023-10-28 DIAGNOSIS — E538 Deficiency of other specified B group vitamins: Secondary | ICD-10-CM

## 2023-10-28 DIAGNOSIS — E559 Vitamin D deficiency, unspecified: Secondary | ICD-10-CM

## 2023-10-28 DIAGNOSIS — Z13220 Encounter for screening for lipoid disorders: Secondary | ICD-10-CM

## 2023-10-28 DIAGNOSIS — E785 Hyperlipidemia, unspecified: Secondary | ICD-10-CM

## 2023-10-28 NOTE — Telephone Encounter (Signed)
 Patient need lab orders.

## 2023-11-04 ENCOUNTER — Other Ambulatory Visit (INDEPENDENT_AMBULATORY_CARE_PROVIDER_SITE_OTHER): Payer: 59

## 2023-11-04 DIAGNOSIS — E538 Deficiency of other specified B group vitamins: Secondary | ICD-10-CM

## 2023-11-04 DIAGNOSIS — E559 Vitamin D deficiency, unspecified: Secondary | ICD-10-CM

## 2023-11-04 DIAGNOSIS — E785 Hyperlipidemia, unspecified: Secondary | ICD-10-CM | POA: Diagnosis not present

## 2023-11-04 LAB — COMPREHENSIVE METABOLIC PANEL
ALT: 13 U/L (ref 0–35)
AST: 16 U/L (ref 0–37)
Albumin: 4.2 g/dL (ref 3.5–5.2)
Alkaline Phosphatase: 50 U/L (ref 39–117)
BUN: 14 mg/dL (ref 6–23)
CO2: 29 meq/L (ref 19–32)
Calcium: 9.4 mg/dL (ref 8.4–10.5)
Chloride: 105 meq/L (ref 96–112)
Creatinine, Ser: 1.02 mg/dL (ref 0.40–1.20)
GFR: 61.88 mL/min (ref >60.00)
Glucose, Bld: 97 mg/dL (ref 70–99)
Potassium: 4.1 meq/L (ref 3.5–5.1)
Sodium: 141 meq/L (ref 135–145)
Total Bilirubin: 0.6 mg/dL (ref 0.2–1.2)
Total Protein: 6.6 g/dL (ref 6.0–8.3)

## 2023-11-04 LAB — LIPID PANEL
Cholesterol: 197 mg/dL (ref 0–200)
HDL: 70.8 mg/dL (ref >39.00)
LDL Cholesterol: 116 mg/dL — ABNORMAL HIGH (ref 0–99)
NonHDL: 125.81
Total CHOL/HDL Ratio: 3
Triglycerides: 50 mg/dL (ref 0.0–149.0)
VLDL: 10 mg/dL (ref 0.0–40.0)

## 2023-11-04 LAB — VITAMIN B12: Vitamin B-12: 446 pg/mL (ref 211–911)

## 2023-11-04 LAB — VITAMIN D 25 HYDROXY (VIT D DEFICIENCY, FRACTURES): VITD: 40.12 ng/mL (ref 30.00–100.00)

## 2023-11-11 ENCOUNTER — Ambulatory Visit (INDEPENDENT_AMBULATORY_CARE_PROVIDER_SITE_OTHER): Payer: 59 | Admitting: Family Medicine

## 2023-11-11 ENCOUNTER — Encounter: Payer: Self-pay | Admitting: Family Medicine

## 2023-11-11 VITALS — BP 100/70 | HR 67 | Temp 98.1°F | Resp 17 | Ht 68.5 in | Wt 142.0 lb

## 2023-11-11 DIAGNOSIS — E538 Deficiency of other specified B group vitamins: Secondary | ICD-10-CM

## 2023-11-11 DIAGNOSIS — E559 Vitamin D deficiency, unspecified: Secondary | ICD-10-CM | POA: Diagnosis not present

## 2023-11-11 DIAGNOSIS — Z0000 Encounter for general adult medical examination without abnormal findings: Secondary | ICD-10-CM

## 2023-11-11 DIAGNOSIS — E785 Hyperlipidemia, unspecified: Secondary | ICD-10-CM

## 2023-11-11 NOTE — Patient Instructions (Addendum)
It was a pleasure meeting you today. Thank you for allowing me to take part in your health care.  Our goals for today as we discussed include:    Recommend Pneumonia 20 vaccine at 65 years  Recommend Hepatitis B vaccination.  2 doses 1 month a part.   Follow up annually for physical and fasting blood work    This is a list of the screening recommended for you and due dates:  Health Maintenance  Topic Date Due   COVID-19 Vaccine (8 - 2024-25 season) 07/28/2023   DTaP/Tdap/Td vaccine (2 - Td or Tdap) 02/28/2024   Pap with HPV screening  05/13/2024   Mammogram  07/11/2024   Colon Cancer Screening  10/04/2028   Flu Shot  Completed   Hepatitis C Screening  Completed   Zoster (Shingles) Vaccine  Completed   HIV Screening  Addressed   Pneumococcal Vaccination  Aged Out   HPV Vaccine  Aged Out      If you have any questions or concerns, please do not hesitate to call the office at [phone].  I look forward to our next visit and until then take care and stay safe.  Regards,   Dana Allan, MD   [facility]

## 2023-11-11 NOTE — Progress Notes (Signed)
 SUBJECTIVE:   Chief Complaint  Patient presents with   Annual Exam    CPE   HPI Presents for annual physical  Discussed the use of AI scribe software for clinical note transcription with the patient, who gave verbal consent to proceed.  History of Present Illness Kara Spencer is a 58 year old female who presents for an annual physical exam.  She is up to date with most vaccinations, but her tetanus booster is due in June. Her mammogram is due in October, and her Pap smear is due in August. She continues to see her OB GYN at [facility] for these screenings.  Her cholesterol levels have decreased but remain elevated. She attributes this improvement to her exercise routine, which she maintained throughout spring, summer, and fall, but has been less consistent during the winter months. She hopes her cholesterol levels will improve further as the weather warms up and she resumes her activities.  She takes vitamin D supplements at a dose of 2000 IU daily, along with a multivitamin. She previously took 5000 IU daily but reduced the dose after her levels were on the higher end of normal. She is considering adjusting her intake to maintain optimal levels without risking toxicity.  Her vitamin B12 levels have decreased slightly but remain within normal limits. She no longer takes a separate B12 supplement, as her multivitamin contains some B12. Her husband requires B12 shots due to absorption issues, but she has not experienced similar problems.  She has a history of digestive issues and food intolerances, which have improved over time but still cause occasional discomfort. She is careful with her diet to manage these symptoms.  She has a history of frequent urination, which she attributes to her high water intake. She has experienced this since childhood and denies any issues with diabetes, as her glucose levels are normal.      PERTINENT PMH / PSH: As above  OBJECTIVE:  BP  100/70 (Cuff Size: Normal)   Pulse 67   Temp 98.1 F (36.7 C) (Oral)   Resp 17   Ht 5' 8.5" (1.74 m)   Wt 142 lb (64.4 kg)   LMP  (LMP Unknown)   BMI 21.28 kg/m    Physical Exam Vitals reviewed.  Constitutional:      General: She is not in acute distress.    Appearance: She is not ill-appearing.  HENT:     Head: Normocephalic.     Right Ear: Tympanic membrane, ear canal and external ear normal.     Left Ear: Tympanic membrane, ear canal and external ear normal.     Nose: Nose normal.     Mouth/Throat:     Mouth: Mucous membranes are moist.  Eyes:     Extraocular Movements: Extraocular movements intact.     Conjunctiva/sclera: Conjunctivae normal.     Pupils: Pupils are equal, round, and reactive to light.  Neck:     Thyroid: No thyromegaly or thyroid tenderness.     Vascular: No carotid bruit.  Cardiovascular:     Rate and Rhythm: Normal rate and regular rhythm.     Pulses: Normal pulses.     Heart sounds: Normal heart sounds.  Pulmonary:     Effort: Pulmonary effort is normal.     Breath sounds: Normal breath sounds.  Abdominal:     General: Bowel sounds are normal. There is no distension.     Palpations: Abdomen is soft.     Tenderness: There is no  abdominal tenderness. There is no right CVA tenderness, left CVA tenderness, guarding or rebound.  Musculoskeletal:        General: Normal range of motion.     Cervical back: Normal range of motion.     Right lower leg: No edema.     Left lower leg: No edema.  Lymphadenopathy:     Cervical: No cervical adenopathy.  Skin:    Capillary Refill: Capillary refill takes less than 2 seconds.  Neurological:     General: No focal deficit present.     Mental Status: She is alert and oriented to person, place, and time. Mental status is at baseline.     Motor: No weakness.  Psychiatric:        Mood and Affect: Mood normal.        Behavior: Behavior normal.        Thought Content: Thought content normal.        Judgment:  Judgment normal.           11/11/2023    1:39 PM 07/14/2022    2:28 PM 08/28/2021    3:18 PM 08/27/2020    3:31 PM 12/13/2017    4:15 PM  Depression screen PHQ 2/9  Decreased Interest 0 1 0 0 0  Down, Depressed, Hopeless 1 0 0 0 0  PHQ - 2 Score 1 1 0 0 0  Altered sleeping 2   0   Tired, decreased energy 1   0   Change in appetite 0   0   Feeling bad or failure about yourself  0   0   Trouble concentrating 0   0   Moving slowly or fidgety/restless 0   0   Suicidal thoughts 0   0   PHQ-9 Score 4   0   Difficult doing work/chores Somewhat difficult   Not difficult at all       11/11/2023    1:39 PM 08/27/2020    3:31 PM  GAD 7 : Generalized Anxiety Score  Nervous, Anxious, on Edge 1 0  Control/stop worrying 1 0  Worry too much - different things 0 0  Trouble relaxing 1 0  Restless 0 0  Easily annoyed or irritable 0 0  Afraid - awful might happen 1 0  Total GAD 7 Score 4 0  Anxiety Difficulty Somewhat difficult Not difficult at all    ASSESSMENT/PLAN:  Annual physical exam Assessment & Plan: Healthy 58 yr old female -Mammogram due in October 2025. Order to be placed by OBGYN. -Recommend regular self breast exams -Colonoscopy not due until 2030. -Pap smear due in August 2025. To be performed by OBGYN. -Tdap UTD.  Due 02/2024 -Flu and Pneumonia vaccines UTD -Consider Hepatitis B vaccination if not completed -Lipid screening completed -Hypertension screening      Hyperlipidemia, unspecified hyperlipidemia type Assessment & Plan: Total cholesterol and LDL slightly elevated, but improved from previous levels. Discussed the importance of maintaining a healthy diet and regular exercise. -Continue current lifestyle modifications. -Provide patient with information on foods that can help lower cholesterol. -The 10-year ASCVD risk score (Arnett DK, et al., 2019) is: 1%   Orders: -     Lipid panel; Future -     Comprehensive metabolic panel; Future  Vitamin B 12  deficiency Assessment & Plan: Current Vitamin B12 levels are within normal range. Patient is taking dissolvable tablets as needed. -Continue current Vitamin B12 supplementation as needed.  Orders: -     Vitamin B12;  Future  Vitamin D deficiency Assessment & Plan: Current Vitamin D levels are within normal range. Patient is taking 2000 IU daily, but levels have decreased slightly. -Increase Vitamin D supplementation to 3000-4000 IU daily. -Recheck Vitamin D levels at next annual visit.  Orders: -     VITAMIN D 25 Hydroxy (Vit-D Deficiency, Fractures); Future   -Check on status of pneumonia vaccine series. Received  Pneumonia 13 in 2019. If incomplete, recommend Pneumovax 23.   PDMP reviewed  Return if symptoms worsen or fail to improve, for PCP.  Dana Allan, MD

## 2023-11-17 ENCOUNTER — Encounter: Payer: Self-pay | Admitting: Family Medicine

## 2023-11-17 DIAGNOSIS — E559 Vitamin D deficiency, unspecified: Secondary | ICD-10-CM | POA: Insufficient documentation

## 2023-11-17 DIAGNOSIS — E538 Deficiency of other specified B group vitamins: Secondary | ICD-10-CM | POA: Insufficient documentation

## 2023-11-17 DIAGNOSIS — E785 Hyperlipidemia, unspecified: Secondary | ICD-10-CM | POA: Insufficient documentation

## 2023-11-17 NOTE — Assessment & Plan Note (Signed)
 Current Vitamin B12 levels are within normal range. Patient is taking dissolvable tablets as needed. -Continue current Vitamin B12 supplementation as needed.

## 2023-11-17 NOTE — Assessment & Plan Note (Signed)
 Total cholesterol and LDL slightly elevated, but improved from previous levels. Discussed the importance of maintaining a healthy diet and regular exercise. -Continue current lifestyle modifications. -Provide patient with information on foods that can help lower cholesterol. -The 10-year ASCVD risk score (Arnett DK, et al., 2019) is: 1%

## 2023-11-17 NOTE — Assessment & Plan Note (Signed)
 Healthy 58 yr old female -Mammogram due in October 2025. Order to be placed by OBGYN. -Recommend regular self breast exams -Colonoscopy not due until 2030. -Pap smear due in August 2025. To be performed by OBGYN. -Tdap UTD.  Due 02/2024 -Flu and Pneumonia vaccines UTD -Consider Hepatitis B vaccination if not completed -Lipid screening completed -Hypertension screening

## 2023-11-17 NOTE — Assessment & Plan Note (Signed)
 Current Vitamin D levels are within normal range. Patient is taking 2000 IU daily, but levels have decreased slightly. -Increase Vitamin D supplementation to 3000-4000 IU daily. -Recheck Vitamin D levels at next annual visit.

## 2024-01-25 ENCOUNTER — Ambulatory Visit: Payer: BC Managed Care – PPO | Admitting: Dermatology

## 2024-01-25 DIAGNOSIS — W908XXA Exposure to other nonionizing radiation, initial encounter: Secondary | ICD-10-CM

## 2024-01-25 DIAGNOSIS — L814 Other melanin hyperpigmentation: Secondary | ICD-10-CM

## 2024-01-25 DIAGNOSIS — Z7189 Other specified counseling: Secondary | ICD-10-CM

## 2024-01-25 DIAGNOSIS — Z85828 Personal history of other malignant neoplasm of skin: Secondary | ICD-10-CM

## 2024-01-25 DIAGNOSIS — L719 Rosacea, unspecified: Secondary | ICD-10-CM

## 2024-01-25 DIAGNOSIS — D225 Melanocytic nevi of trunk: Secondary | ICD-10-CM

## 2024-01-25 DIAGNOSIS — D2371 Other benign neoplasm of skin of right lower limb, including hip: Secondary | ICD-10-CM

## 2024-01-25 DIAGNOSIS — D1801 Hemangioma of skin and subcutaneous tissue: Secondary | ICD-10-CM

## 2024-01-25 DIAGNOSIS — D239 Other benign neoplasm of skin, unspecified: Secondary | ICD-10-CM

## 2024-01-25 DIAGNOSIS — D2272 Melanocytic nevi of left lower limb, including hip: Secondary | ICD-10-CM

## 2024-01-25 DIAGNOSIS — L578 Other skin changes due to chronic exposure to nonionizing radiation: Secondary | ICD-10-CM

## 2024-01-25 DIAGNOSIS — L821 Other seborrheic keratosis: Secondary | ICD-10-CM

## 2024-01-25 DIAGNOSIS — D2271 Melanocytic nevi of right lower limb, including hip: Secondary | ICD-10-CM

## 2024-01-25 DIAGNOSIS — S80811A Abrasion, right lower leg, initial encounter: Secondary | ICD-10-CM

## 2024-01-25 DIAGNOSIS — Z1283 Encounter for screening for malignant neoplasm of skin: Secondary | ICD-10-CM

## 2024-01-25 DIAGNOSIS — Z86018 Personal history of other benign neoplasm: Secondary | ICD-10-CM

## 2024-01-25 DIAGNOSIS — D229 Melanocytic nevi, unspecified: Secondary | ICD-10-CM

## 2024-01-25 NOTE — Patient Instructions (Addendum)
 Counseling for BBL / IPL / Laser and Coordination of Care Discussed the treatment option of Broad Band Light (BBL) /Intense Pulsed Light (IPL)/ Laser for skin discoloration, including brown spots and redness.  Typically we recommend at least 1-3 treatment sessions about 5-8 weeks apart for best results.  Cannot have tanned skin when BBL performed, and regular use of sunscreen/photoprotection is advised after the procedure to help maintain results. The patient's condition may also require "maintenance treatments" in the future.  The fee for BBL / laser treatments is $350 per treatment session for the whole face.  A fee can be quoted for other parts of the body.  Insurance typically does not pay for BBL/laser treatments and therefore the fee is an out-of-pocket cost. Recommend prophylactic valtrex treatment. Once scheduled for procedure, will send Rx in prior to patient's appointment.     Melanoma ABCDEs  Melanoma is the most dangerous type of skin cancer, and is the leading cause of death from skin disease.  You are more likely to develop melanoma if you: Have light-colored skin, light-colored eyes, or red or blond hair Spend a lot of time in the sun Tan regularly, either outdoors or in a tanning bed Have had blistering sunburns, especially during childhood Have a close family member who has had a melanoma Have atypical moles or large birthmarks  Early detection of melanoma is key since treatment is typically straightforward and cure rates are extremely high if we catch it early.   The first sign of melanoma is often a change in a mole or a new dark spot.  The ABCDE system is a way of remembering the signs of melanoma.  A for asymmetry:  The two halves do not match. B for border:  The edges of the growth are irregular. C for color:  A mixture of colors are present instead of an even brown color. D for diameter:  Melanomas are usually (but not always) greater than 6mm - the size of a pencil  eraser. E for evolution:  The spot keeps changing in size, shape, and color.  Please check your skin once per month between visits. You can use a small mirror in front and a large mirror behind you to keep an eye on the back side or your body.   If you see any new or changing lesions before your next follow-up, please call to schedule a visit.  Please continue daily skin protection including broad spectrum sunscreen SPF 30+ to sun-exposed areas, reapplying every 2 hours as needed when you're outdoors.   Staying in the shade or wearing long sleeves, sun glasses (UVA+UVB protection) and wide brim hats (4-inch brim around the entire circumference of the hat) are also recommended for sun protection.       Due to recent changes in healthcare laws, you may see results of your pathology and/or laboratory studies on MyChart before the doctors have had a chance to review them. We understand that in some cases there may be results that are confusing or concerning to you. Please understand that not all results are received at the same time and often the doctors may need to interpret multiple results in order to provide you with the best plan of care or course of treatment. Therefore, we ask that you please give us  2 business days to thoroughly review all your results before contacting the office for clarification. Should we see a critical lab result, you will be contacted sooner.   If You Need Anything After  Your Visit  If you have any questions or concerns for your doctor, please call our main line at [phone] and press option 4 to reach your doctor's medical assistant. If no one answers, please leave a voicemail as directed and we will return your call as soon as possible. Messages left after 4 pm will be answered the following business day.   You may also send us  a message via MyChart. We typically respond to MyChart messages within 1-2 business days.  For prescription refills, please ask your  pharmacy to contact our office. Our fax number is [phone].  If you have an urgent issue when the clinic is closed that cannot wait until the next business day, you can page your doctor at the number below.    Please note that while we do our best to be available for urgent issues outside of office hours, we are not available 24/7.   If you have an urgent issue and are unable to reach us , you may choose to seek medical care at your doctor's office, retail clinic, urgent care center, or emergency room.  If you have a medical emergency, please immediately call 911 or go to the emergency department.  Pager Numbers  - Dr. Bary Likes: [phone]  - Dr. Annette Barters: [phone]  - Dr. Felipe Horton: [phone]   In the event of inclement weather, please call our main line at [phone] for an update on the status of any delays or closures.  Dermatology Medication Tips: Please keep the boxes that topical medications come in in order to help keep track of the instructions about where and how to use these. Pharmacies typically print the medication instructions only on the boxes and not directly on the medication tubes.   If your medication is too expensive, please contact our office at [phone] option 4 or send us  a message through MyChart.   We are unable to tell what your co-pay for medications will be in advance as this is different depending on your insurance coverage. However, we may be able to find a substitute medication at lower cost or fill out paperwork to get insurance to cover a needed medication.   If a prior authorization is required to get your medication covered by your insurance company, please allow us  1-2 business days to complete this process.  Drug prices often vary depending on where the prescription is filled and some pharmacies may offer cheaper prices.  The website www.goodrx.com contains coupons for medications through different pharmacies. The prices here do not  account for what the cost may be with help from insurance (it may be cheaper with your insurance), but the website can give you the price if you did not use any insurance.  - You can print the associated coupon and take it with your prescription to the pharmacy.  - You may also stop by our office during regular business hours and pick up a GoodRx coupon card.  - If you need your prescription sent electronically to a different pharmacy, notify our office through [facility] or by phone at [phone] option 4.     Si Usted Necesita Algo Despus de Su Visita  Tambin puede enviarnos un mensaje a travs de Clinical cytogeneticist. Por lo general respondemos a los mensajes de MyChart en el transcurso de 1 a 2 das hbiles.  Para renovar recetas, por favor pida a su farmacia que se ponga en contacto con nuestra oficina. Franz Jacks de fax es [place] [phone].  Si tiene un asunto urgente  cuando la clnica est cerrada y que no puede esperar hasta el siguiente da hbil, puede llamar/localizar a su doctor(a) al nmero que aparece a continuacin.   Por favor, tenga en cuenta que aunque hacemos todo lo posible para estar disponibles para asuntos urgentes fuera del horario de [place], no estamos disponibles las 24 horas del da, los 7 [address]  [address] de la [place].   Si tiene un problema urgente y no puede comunicarse con nosotros, puede optar por buscar atencin mdica  en el consultorio de su doctor(a), en una clnica privada, en un centro de atencin urgente o en una sala de emergencias.  Si tiene Engineer, drilling, por favor llame inmediatamente al 911 o vaya a la sala de emergencias.  Nmeros de bper  - Dr. Bary Likes: [phone]  - Dra. Annette Barters: [phone]  - Dr. Felipe Horton: [phone]   En caso de inclemencias del tiempo, por favor llame a Lajuan Pila principal al [phone] para una actualizacin sobre el [place] de cualquier retraso o cierre.  Consejos para la medicacin en dermatologa: Por  favor, guarde las cajas en las que vienen los medicamentos de uso tpico para ayudarle a seguir las instrucciones sobre dnde y cmo usarlos. Las farmacias generalmente imprimen las instrucciones del medicamento slo en las cajas y no directamente en los tubos del [place].   Si su medicamento es muy caro, por favor, pngase en contacto con Bettyjane Brunet llamando al [phone] y presione la opcin 4 o envenos un mensaje a travs de Clinical cytogeneticist.   No podemos decirle cul ser su copago por los medicamentos por adelantado ya que esto es diferente dependiendo de la cobertura de su seguro. Sin embargo, es posible que podamos encontrar un medicamento sustituto a Audiological scientist un formulario para que el seguro cubra el medicamento que se considera necesario.   Si se requiere una autorizacin previa para que su compaa de seguros [place] su medicamento, por favor permtanos de 1 a 2 das hbiles para completar este proceso.  Los precios de los medicamentos varan con frecuencia dependiendo del Environmental consultant de dnde se surte la receta y alguna farmacias pueden ofrecer precios ms baratos.  El sitio web www.goodrx.com tiene cupones para medicamentos de Health and safety inspector. Los precios aqu no tienen en cuenta lo que podra costar con la ayuda del seguro (puede ser ms barato con su seguro), pero el sitio web puede darle el precio si no utiliz Tourist information centre manager.  - Puede imprimir el cupn correspondiente y llevarlo con su receta a la farmacia.  - Tambin puede pasar por nuestra oficina durante el horario de atencin regular y Education officer, museum una tarjeta de cupones de GoodRx.  - Si necesita que su receta se enve electrnicamente a una farmacia diferente, informe a nuestra oficina a travs de MyChart de [place] o por telfono llamando al [phone] y presione la opcin 4.

## 2024-01-25 NOTE — Progress Notes (Signed)
 Follow-Up Visit   Subjective  Kara Spencer is a 58 y.o. female who presents for the following: Skin Cancer Screening and Full Body Skin Exam  The patient presents for Total-Body Skin Exam (TBSE) for skin cancer screening and mole check. The patient has spots, moles and lesions to be evaluated, some may be new or changing. History of BCCs, dysplastic nevi. Red light with debridement to face 6 months ago, not much reaction per patient.    The following portions of the chart were reviewed this encounter and updated as appropriate: medications, allergies, medical history  Review of Systems:  No other skin or systemic complaints except as noted in HPI or Assessment and Plan.  Objective  Well appearing patient in no apparent distress; mood and affect are within normal limits.  A full examination was performed including scalp, head, eyes, ears, nose, lips, neck, chest, axillae, abdomen, back, buttocks, bilateral upper extremities, bilateral lower extremities, hands, feet, fingers, toes, fingernails, and toenails. All findings within normal limits unless otherwise noted below.   Relevant physical exam findings are noted in the Assessment and Plan.    Assessment & Plan   SKIN CANCER SCREENING PERFORMED TODAY.  ACTINIC DAMAGE - Chronic condition, secondary to cumulative UV/sun exposure - diffuse scaly erythematous macules with underlying dyspigmentation - Recommend daily broad spectrum sunscreen SPF 30+ to sun-exposed areas, reapply every 2 hours as needed.  - Staying in the shade or wearing long sleeves, sun glasses (UVA+UVB protection) and wide brim hats (4-inch brim around the entire circumference of the hat) are also recommended for sun protection.  - Call for new or changing lesions.  LENTIGINES, SEBORRHEIC KERATOSES, HEMANGIOMAS - Benign normal skin lesions - Benign-appearing - Call for any changes  LENTIGINES Exam: scattered tan macules at cheeks Due to sun  exposure Treatment Plan: Benign-appearing, observe. Recommend daily broad spectrum sunscreen SPF 30+ to sun-exposed areas, reapply every 2 hours as needed.  Call for any changes Counseling for BBL / IPL / Laser and Coordination of Care Discussed the treatment option of Broad Band Light (BBL) /Intense Pulsed Light (IPL)/ Laser for skin discoloration, including brown spots and redness.  Typically we recommend at least 1-3 treatment sessions about 5-8 weeks apart for best results.  Cannot have tanned skin when BBL performed, and regular use of sunscreen/photoprotection is advised after the procedure to help maintain results. The patient's condition may also require "maintenance treatments" in the future.  The fee for BBL / laser treatments is $350 per treatment session for the whole face.  A fee can be quoted for other parts of the body.  Insurance typically does not pay for BBL/laser treatments and therefore the fee is an out-of-pocket cost. Recommend prophylactic valtrex treatment. Once scheduled for procedure, will send Rx in prior to patient's appointment.   MELANOCYTIC NEVI - Tan-brown and/or pink-flesh-colored symmetric macules and papules - R lat buttock - 7.0 x 3.31mm med brown macule appears as 2 adjacent - R lower hip/upper thigh - 3.15mm med dark brown macule - R upper pretibia - 2.51mm med brown macule - Spinal upper back - 5.58mm speckled tan macule with darker lateral 1.20mm macule, adjacent to bx site - Superior umbilicus - 4.77mm brown macule, stable compared to photo from 08/05/2020 - Left medial plantar foot 4 mm med light brown macule - Benign appearing on exam today - Observation - Call clinic for new or changing moles - Recommend daily use of broad spectrum spf 30+ sunscreen to sun-exposed areas.   IRRITATED  DERMATOFIBROMA Exam: -right calf pink firm papule with excoriation  Benign-appearing, observe.   Dermatofibroma - Firm pink/brown papulenodule with dimple sign - L post  ankle, R ant thigh, L ant thigh, R forearm A dermatofibroma is a benign growth possibly related to trauma, such as an insect bite, cut from shaving, or inflamed acne-type bump.  Treatment options to remove include shave or excision with resulting scar and risk of recurrence.  Since not bothersome, will observe for now.    HISTORY OF BASAL CELL CARCINOMA OF THE SKIN Multiple locations see history  R medial shoulder, R neck, L mid lower back  - No evidence of recurrence today - Recommend regular full body skin exams - Recommend daily broad spectrum sunscreen SPF 30+ to sun-exposed areas, reapply every 2 hours as needed.  - Call if any new or changing lesions are noted between office visits   HISTORY OF DYSPLASTIC NEVUS Multiple locations see history L upper abdomen, L post shoulder, L sternum, R spinal lower back, L upper abdomen, L spinal upper back  No evidence of recurrence today Recommend regular full body skin exams Recommend daily broad spectrum sunscreen SPF 30+ to sun-exposed areas, reapply every 2 hours as needed.  Call if any new or changing lesions are noted between office visits   Early AK vs Resolving Inflammatory Papule  Exam: Tiny scaly macule at right nasal tip.  No treatment today. Observation. Recheck on follow-up.  ROSACEA Exam Mild erythema cheeks, nose. Patient has few pimples occasionally.  Chronic and persistent condition with duration or expected duration over one year. Condition is improving with treatment but not currently at goal.    Rosacea is a chronic progressive skin condition usually affecting the face of adults, causing redness and/or acne bumps. It is treatable but not curable. It sometimes affects the eyes (ocular rosacea) as well. It may respond to topical and/or systemic medication and can flare with stress, sun exposure, alcohol, exercise, topical steroids (including hydrocortisone/cortisone 10) and some foods.  Daily application of broad spectrum  spf 30+ sunscreen to face is recommended to reduce flares.  Treatment Plan Improved with BBL treatment from 07/2022.Aaron AasDiscussed 2nd treatment. Discussed topical treatment, patient may consider if Rosacea worsens.  Counseling for BBL / IPL / Laser and Coordination of Care Discussed the treatment option of Broad Band Light (BBL) /Intense Pulsed Light (IPL)/ Laser for skin discoloration, including brown spots and redness.  Typically we recommend at least 1-3 treatment sessions about 5-8 weeks apart for best results.  Cannot have tanned skin when BBL performed, and regular use of sunscreen/photoprotection is advised after the procedure to help maintain results. The patient's condition may also require "maintenance treatments" in the future.  The fee for BBL / laser treatments is $350 per treatment session for the whole face.  A fee can be quoted for other parts of the body.  Insurance typically does not pay for BBL/laser treatments and therefore the fee is an out-of-pocket cost. Recommend prophylactic valtrex treatment. Once scheduled for procedure, will send Rx in prior to patient's appointment.    Return in about 6 months (around 07/27/2024) for TBSE, Hx BCC. 2nd BBL tx to face this fall. Cheyenne Cotta, CMA, am acting as scribe for Artemio Larry, MD .   Documentation: I have reviewed the above documentation for accuracy and completeness, and I agree with the above.  Artemio Larry, MD

## 2024-05-25 ENCOUNTER — Other Ambulatory Visit: Payer: Self-pay | Admitting: Obstetrics and Gynecology

## 2024-05-25 DIAGNOSIS — Z1231 Encounter for screening mammogram for malignant neoplasm of breast: Secondary | ICD-10-CM

## 2024-07-14 ENCOUNTER — Ambulatory Visit
Admission: RE | Admit: 2024-07-14 | Discharge: 2024-07-14 | Disposition: A | Discharge status: Home / Self Care | Source: Ambulatory Visit | Attending: Obstetrics and Gynecology | Admitting: Obstetrics and Gynecology

## 2024-07-14 DIAGNOSIS — Z1231 Encounter for screening mammogram for malignant neoplasm of breast: Secondary | ICD-10-CM | POA: Insufficient documentation

## 2024-07-19 ENCOUNTER — Telehealth: Payer: Self-pay

## 2024-07-19 NOTE — Telephone Encounter (Signed)
 Called patient back. She will start Valtrex 500 MG BID x 1 week starting 1 day prior to BBL treatment.

## 2024-07-19 NOTE — Telephone Encounter (Signed)
 Left message for patient to call back regarding BBL appointment 07/24/24. Need to discuss prophylactic Valtrex treatment.

## 2024-07-24 ENCOUNTER — Ambulatory Visit (INDEPENDENT_AMBULATORY_CARE_PROVIDER_SITE_OTHER): Payer: Self-pay | Admitting: Dermatology

## 2024-07-24 DIAGNOSIS — L814 Other melanin hyperpigmentation: Secondary | ICD-10-CM

## 2024-07-24 DIAGNOSIS — L719 Rosacea, unspecified: Secondary | ICD-10-CM

## 2024-07-24 NOTE — Progress Notes (Signed)
 Follow-Up Visit   Subjective  Kara Spencer is a 58 y.o. female who presents for the following: BBL treatment today. Patient with Rosacea and Lentigines.   The following portions of the chart were reviewed this encounter and updated as appropriate: medications, allergies, medical history  Review of Systems:  No other skin or systemic complaints except as noted in HPI or Assessment and Plan.  Objective  Well appearing patient in no apparent distress; mood and affect are within normal limits.  A focused examination was performed of the following areas: Face  Relevant physical exam findings are noted in the Assessment and Plan.  face Scattered tan macules.  face Mid face erythema with telangiectasias.              Assessment & Plan   LENTIGINES face Follow-up laser kit given to patient today.  Photorejuvenation - face Prior to the procedure, the patient's past medical history, medications, allergies, and the rare but potential risks and complications were reviewed with the patient and a signed consent was obtained.  Pre and post treatment care was discussed and instructions provided.   [place] BBL - 07/24/24 1400      Patient Details   Skin Type: II    Anesthestic Cream Applied: No    Photo Takes: Yes    Consent Signed: --   On file   Improvement from Previous Treatment: Yes      Treatment Details   Date: 07/24/24    Treatment #: 2    Area: face    Filter: 1st Pass;2nd Pass     1st Pass   Location: Other   cheeks, chin   Device: 515 Filter    BBL j/cm2: 10    PW Msec Sec: 10    Cooling Temp: 15    Pulses: 100    15x45: This crystal used.      2nd Pass   Location: Other    Device: --   forehead, nose, chin, upper lip, infraocular   BBL j/cm2: 515 Filter    PW Msec Sec: 12    Cooling Temp: 10    Target Temp: 15    Pulses: 188    15x15: This crystal used.      Patient tolerated the procedure well.   Laser safety: Patient was advised in  laser safety.  Patient was fitted with laser safety goggles and advised to keep eyes closed during procedure with goggles on. Staff and provider ensured that patient and their own safety goggles were also on and eyes protected during procedure. Laser room door was secured and locked from the inside. Laser room door has laser safety sign affixed to the outside of the door.  Austin avoidance was stressed. The patient will call with any problems, questions or concerns prior to their next appointment.    ROSACEA face Rosacea is a chronic progressive skin condition usually affecting the face of adults, causing redness and/or acne bumps. It is treatable but not curable. It sometimes affects the eyes (ocular rosacea) as well. It may respond to topical and/or systemic medication and can flare with stress, sun exposure, alcohol, exercise, topical steroids (including hydrocortisone/cortisone 10) and some foods.  Daily application of broad spectrum spf 30+ sunscreen to face is recommended to reduce flares. Photorejuvenation - face Prior to the procedure, the patient's past medical history, medications, allergies, and the rare but potential risks and complications were reviewed with the patient and a signed consent was obtained.  Pre and post treatment  care was discussed and instructions provided.   [place] BBL - 07/24/24 1400      Patient Details   Skin Type: II    Anesthestic Cream Applied: No    Photo Takes: Yes    Consent Signed: --   On file   Improvement from Previous Treatment: Yes      Treatment Details   Date: 07/24/24    Treatment #: 2    Area: face    Filter: 3rd Pass      3rd Pass   Location: Other    Device: 560 Filter   cheeks, nose, chin, upper lip   BBL j/cm2: 15    PW Msec Sec: 15    Cooling Temp: 15    Pulses: 72    15x15: This crystal used.        Patient tolerated the procedure well.   Austin avoidance was stressed. The patient will call with any problems, questions or  concerns prior to their next appointment.      Return as scheduled, for TBSE.  IAndrea Kerns, CMA, am acting as scribe for Rexene Rattler, MD .   Documentation: I have reviewed the above documentation for accuracy and completeness, and I agree with the above.  Rexene Rattler, MD

## 2024-07-24 NOTE — Patient Instructions (Signed)

## 2024-08-08 ENCOUNTER — Ambulatory Visit: Admitting: Dermatology

## 2024-08-08 DIAGNOSIS — W908XXA Exposure to other nonionizing radiation, initial encounter: Secondary | ICD-10-CM

## 2024-08-08 DIAGNOSIS — Z86018 Personal history of other benign neoplasm: Secondary | ICD-10-CM

## 2024-08-08 DIAGNOSIS — B353 Tinea pedis: Secondary | ICD-10-CM

## 2024-08-08 DIAGNOSIS — L578 Other skin changes due to chronic exposure to nonionizing radiation: Secondary | ICD-10-CM

## 2024-08-08 DIAGNOSIS — L814 Other melanin hyperpigmentation: Secondary | ICD-10-CM

## 2024-08-08 DIAGNOSIS — L570 Actinic keratosis: Secondary | ICD-10-CM | POA: Diagnosis not present

## 2024-08-08 DIAGNOSIS — D229 Melanocytic nevi, unspecified: Secondary | ICD-10-CM

## 2024-08-08 DIAGNOSIS — D2371 Other benign neoplasm of skin of right lower limb, including hip: Secondary | ICD-10-CM

## 2024-08-08 DIAGNOSIS — D239 Other benign neoplasm of skin, unspecified: Secondary | ICD-10-CM

## 2024-08-08 DIAGNOSIS — Z85828 Personal history of other malignant neoplasm of skin: Secondary | ICD-10-CM

## 2024-08-08 DIAGNOSIS — L719 Rosacea, unspecified: Secondary | ICD-10-CM

## 2024-08-08 DIAGNOSIS — D2372 Other benign neoplasm of skin of left lower limb, including hip: Secondary | ICD-10-CM

## 2024-08-08 DIAGNOSIS — Z1283 Encounter for screening for malignant neoplasm of skin: Secondary | ICD-10-CM | POA: Diagnosis not present

## 2024-08-08 DIAGNOSIS — Z7189 Other specified counseling: Secondary | ICD-10-CM

## 2024-08-08 DIAGNOSIS — D1801 Hemangioma of skin and subcutaneous tissue: Secondary | ICD-10-CM

## 2024-08-08 DIAGNOSIS — D2271 Melanocytic nevi of right lower limb, including hip: Secondary | ICD-10-CM

## 2024-08-08 DIAGNOSIS — D2272 Melanocytic nevi of left lower limb, including hip: Secondary | ICD-10-CM

## 2024-08-08 DIAGNOSIS — D2261 Melanocytic nevi of right upper limb, including shoulder: Secondary | ICD-10-CM

## 2024-08-08 DIAGNOSIS — D225 Melanocytic nevi of trunk: Secondary | ICD-10-CM

## 2024-08-08 DIAGNOSIS — L821 Other seborrheic keratosis: Secondary | ICD-10-CM

## 2024-08-08 NOTE — Progress Notes (Signed)
 Follow-Up Visit   Subjective  Kara Spencer is a 58 y.o. female who presents for the following: Skin Cancer Screening and Full Body Skin Exam  The patient presents for Total-Body Skin Exam (TBSE) for skin cancer screening and mole check. The patient has spots, moles and lesions to be evaluated, some may be new or changing.    The following portions of the chart were reviewed this encounter and updated as appropriate: medications, allergies, medical history  Review of Systems:  No other skin or systemic complaints except as noted in HPI or Assessment and Plan.  Objective  Well appearing patient in no apparent distress; mood and affect are within normal limits.  A full examination was performed including scalp, head, eyes, ears, nose, lips, neck, chest, axillae, abdomen, back, buttocks, bilateral upper extremities, bilateral lower extremities, hands, feet, fingers, toes, fingernails, and toenails. All findings within normal limits unless otherwise noted below.   Relevant physical exam findings are noted in the Assessment and Plan.  L med cheek, L nasal tip, R cheek Pink scaly macules.  Assessment & Plan   SKIN CANCER SCREENING PERFORMED TODAY.  ACTINIC DAMAGE WITH PRECANCEROUS ACTINIC KERATOSES Counseling for Topical Chemotherapy Management: Patient exhibits: - Severe, confluent actinic changes with pre-cancerous actinic keratoses that is secondary to cumulative UV radiation exposure over time - Condition that is severe; chronic, not at goal. - diffuse scaly erythematous macules and papules with underlying dyspigmentation - Discussed Prescription Field Treatment topical Chemotherapy for Severe, Chronic Confluent Actinic Changes with Pre-Cancerous Actinic Keratoses Field treatment involves treatment of an entire area of skin that has confluent Actinic Changes (Sun/ Ultraviolet light damage) and PreCancerous Actinic Keratoses by method of PhotoDynamic Therapy (PDT) and/or  prescription Topical Chemotherapy agents such as 5-fluorouracil , 5-fluorouracil /calcipotriene, and/or imiquimod.  The purpose is to decrease the number of clinically evident and subclinical PreCancerous lesions to prevent progression to development of skin cancer by chemically destroying early precancer changes that may or may not be visible.  It has been shown to reduce the risk of developing skin cancer in the treated area. As a result of treatment, redness, scaling, crusting, and open sores may occur during treatment course. One or more than one of these methods may be used and may have to be used several times to control, suppress and eliminate the PreCancerous changes. Discussed treatment course, expected reaction, and possible side effects. - Recommend daily broad spectrum sunscreen SPF 30+ to sun-exposed areas, reapply every 2 hours as needed.  - Staying in the shade or wearing long sleeves, sun glasses (UVA+UVB protection) and wide brim hats (4-inch brim around the entire circumference of the hat) are also recommended. - Call for new or changing lesions. - Will schedule photodynamic therapy to the face. Red light with debridement.   LENTIGINES, SEBORRHEIC KERATOSES, HEMANGIOMAS - Benign normal skin lesions - Benign-appearing - Call for any changes  MELANOCYTIC NEVI - Tan-brown and/or pink-flesh-colored symmetric macules and papules - R lat buttock - 7 x 3 mm med brown macule appears as 2 adjacent - R lower hip/upper thigh - 3 mm med dark brown macule - R upper pretibia - 2.33mm med brown macule - Spinal upper back - 5 mm speckled tan macule with darker lateral 1 mm macule - Superior umbilicus - 4 mm brown macule, stable compared to photo from 08/05/2020 - Left medial plantar foot 4 mm light tan macule - R palm - 3 mm tan macule - R breast 6 mm pink tan papule - Benign appearing  on exam today - Observation - Call clinic for new or changing moles - Recommend daily use of broad spectrum  spf 30+ sunscreen to sun-exposed areas.   Dermatofibroma - Firm pink/brown papulenodule with dimple sign - L post ankle, R ant thigh, L ant thigh, R forearm A dermatofibroma is a benign growth possibly related to trauma, such as an insect bite, cut from shaving, or inflamed acne-type bump.  Treatment options to remove include shave or excision with resulting scar and risk of recurrence.  Since not bothersome, will observe for now.    HISTORY OF BASAL CELL CARCINOMA OF THE SKIN Multiple locations see history  R medial shoulder, R neck, L mid lower back  - No evidence of recurrence today - Recommend regular full body skin exams - Recommend daily broad spectrum sunscreen SPF 30+ to sun-exposed areas, reapply every 2 hours as needed.  - Call if any new or changing lesions are noted between office visits   HISTORY OF DYSPLASTIC NEVUS Multiple locations see history L upper abdomen, L post shoulder, L sternum, R spinal lower back, L upper abdomen, L spinal upper back  No evidence of recurrence today Recommend regular full body skin exams Recommend daily broad spectrum sunscreen SPF 30+ to sun-exposed areas, reapply every 2 hours as needed.  Call if any new or changing lesions are noted between office visits  FLAT SEBORRHEIC KERATOSIS VS LENTIGINES - Stuck-on, waxy, tan-brown macules  L lat cheek - Benign-appearing - Discussed benign etiology and prognosis. - Observe - Call for any changes Discussed cosmetic procedure cryotherapy, noncovered.  $60 for 1st lesion and $15 for each additional lesion if done on the same day.  Maximum charge $350.  One touch-up treatment included no charge. Discussed risks of treatment including dyspigmentation, small scar, and/or recurrence. Recommend daily broad spectrum sunscreen SPF 30+/photoprotection to treated areas once healed. Patient will schedule.  ROSACEA Exam Mid face erythema with telangiectasias   Chronic and persistent condition with duration or  expected duration over one year. Condition is improving with treatment but not currently at goal.  Rosacea is a chronic progressive skin condition usually affecting the face of adults, causing redness and/or acne bumps. It is treatable but not curable. It sometimes affects the eyes (ocular rosacea) as well. It may respond to topical and/or systemic medication and can flare with stress, sun exposure, alcohol, exercise, topical steroids (including hydrocortisone/cortisone 10) and some foods.  Daily application of broad spectrum spf 30+ sunscreen to face is recommended to reduce flares.  Treatment Plan Some improvement with BBL treatment.  May need additional treatments for best result.  TINEA PEDIS Exam: Scaling and maceration web spaces and over distal and lateral soles. L 2nd and 4th webspace with scaling and maceration  Treatment Plan: Start over the counter Lotrimin (clotrimazole) or Micatin (miconazole) cream to feet including web spaces twice daily for one month. Then apply 1-2 times per week to help prevent recurrence.     AK (ACTINIC KERATOSIS) L med cheek, L nasal tip, R cheek Not treated with cryotherapy today. Patient will have red light with debridement to face.   Actinic keratoses are precancerous spots that appear secondary to cumulative UV radiation exposure/sun exposure over time. They are chronic with expected duration over 1 year. A portion of actinic keratoses will progress to squamous cell carcinoma of the skin. It is not possible to reliably predict which spots will progress to skin cancer and so treatment is recommended to prevent development of skin cancer.  Recommend daily broad spectrum sunscreen SPF 30+ to sun-exposed areas, reapply every 2 hours as needed.  Recommend staying in the shade or wearing long sleeves, sun glasses (UVA+UVB protection) and wide brim hats (4-inch brim around the entire circumference of the hat). Call for new or changing lesions.  Recheck  areas after PDT treatment  Return for Wed appt for red light with debridement to the face. as sched TBSE. Will sched appt for cosmetic LN2.  IAndrea Kerns, CMA, am acting as scribe for Rexene Rattler, MD .   Documentation: I have reviewed the above documentation for accuracy and completeness, and I agree with the above.  Rexene Rattler, MD

## 2024-08-08 NOTE — Patient Instructions (Addendum)
 Rosacea  What is rosacea? Rosacea (say: ro-zay-sha) is a common skin disease that usually begins as a trend of flushing or blushing easily.  As rosacea progresses, a persistent redness in the center of the face will develop and may gradually spread beyond the nose and cheeks to the forehead and chin.  In some cases, the ears, chest, and back could be affected.  Rosacea may appear as tiny blood vessels or small red bumps that occur in crops.  Frequently they can contain pus, and are called "pustules".  If the bumps do not contain pus, they are referred to as "papules".  Rarely, in prolonged, untreated cases of rosacea, the oil glands of the nose and cheeks may become permanently enlarged.  This is called rhinophyma, and is seen more frequently in men.  Signs and Risks In its beginning stages, rosacea tends to come and go, which makes it difficult to recognize.  It can start as intermittent flushing of the face.  Eventually, blood vessels may become permanently visible.  Pustules and papules can appear, but can be mistaken for adult acne.  People of all races, ages, genders and ethnic groups are at risk of developing rosacea.  However, it is more common in women (especially around menopause) and adults with fair skin between the ages of 26 and 90.  Treatment Dermatologists typically recommend a combination of treatments to effectively manage rosacea.  Treatment can improve symptoms and may stop the progression of the rosacea.  Treatment may involve both topical and oral medications.  The tetracycline antibiotics are often used for their anti-inflammatory effect; however, because of the possibility of developing antibiotic resistance, they should not be used long term at full dose.  For dilated blood vessels the options include electrodessication (uses electric current through a small needle), laser treatment, and cosmetics to hide the redness.   With all forms of treatment, improvement is a slow process, and  patients may not see any results for the first 3-4 weeks.  It is very important to avoid the sun and other triggers.  Patients must wear sunscreen daily.  Skin Care Instructions: Cleanse the skin with a mild soap such as CeraVe cleanser, Cetaphil cleanser, or Dove soap once or twice daily as needed. Moisturize with Eucerin Redness Relief Daily Perfecting Lotion (has a subtle green tint), CeraVe Moisturizing Cream, or Oil of Olay Daily Moisturizer with sunscreen every morning and/or night as recommended. Makeup should be "non-comedogenic" (won't clog pores) and be labeled "for sensitive skin". Good choices for cosmetics are: Neutrogena, Almay, and Physician's Formula.  Any product with a green tint tends to offset a red complexion. If your eyes are dry and irritated, use artificial tears 2-3 times per day and cleanse the eyelids daily with baby shampoo.  Have your eyes examined at least every 2 years.  Be sure to tell your eye doctor that you have rosacea. Alcoholic beverages tend to cause flushing of the skin, and may make rosacea worse. Always wear sunscreen, protect your skin from extreme hot and cold temperatures, and avoid spicy foods, hot drinks, and mechanical irritation such as rubbing, scrubbing, or massaging the face.  Avoid harsh skin cleansers, cleansing masks, astringents, and exfoliation. If a particular product burns or makes your face feel tight, then it is likely to flare your rosacea. If you are having difficulty finding a sunscreen that you can tolerate, you may try switching to a chemical-free sunscreen.  These are ones whose active ingredient is zinc oxide or titanium dioxide  only.  They should also be fragrance free, non-comedogenic, and labeled for sensitive skin. Rosacea triggers may vary from person to person.  There are a variety of foods that have been reported to trigger rosacea.  Some patients find that keeping a diary of what they were doing when they flared helps them avoid  triggers.   Due to recent changes in healthcare laws, you may see results of your pathology and/or laboratory studies on MyChart before the doctors have had a chance to review them. We understand that in some cases there may be results that are confusing or concerning to you. Please understand that not all results are received at the same time and often the doctors may need to interpret multiple results in order to provide you with the best plan of care or course of treatment. Therefore, we ask that you please give us  2 business days to thoroughly review all your results before contacting the office for clarification. Should we see a critical lab result, you will be contacted sooner.   If You Need Anything After Your Visit  If you have any questions or concerns for your doctor, please call our main line at [phone] and press option 4 to reach your doctor's medical assistant. If no one answers, please leave a voicemail as directed and we will return your call as soon as possible. Messages left after 4 pm will be answered the following business day.   You may also send us  a message via MyChart. We typically respond to MyChart messages within 1-2 business days.  For prescription refills, please ask your pharmacy to contact our office. Our fax number is [phone].  If you have an urgent issue when the clinic is closed that cannot wait until the next business day, you can page your doctor at the number below.    Please note that while we do our best to be available for urgent issues outside of office hours, we are not available 24/7.   If you have an urgent issue and are unable to reach us , you may choose to seek medical care at your doctor's office, retail clinic, urgent care center, or emergency room.  If you have a medical emergency, please immediately call 911 or go to the emergency department.  Pager Numbers  - Dr. Hester: [phone]  - Dr. Jackquline: [phone]  - Dr. Claudene:  [phone]   - Dr. Raymund: [phone]  In the event of inclement weather, please call our main line at [phone] for an update on the status of any delays or closures.  Dermatology Medication Tips: Please keep the boxes that topical medications come in in order to help keep track of the instructions about where and how to use these. Pharmacies typically print the medication instructions only on the boxes and not directly on the medication tubes.   If your medication is too expensive, please contact our office at [phone] option 4 or send us  a message through MyChart.   We are unable to tell what your co-pay for medications will be in advance as this is different depending on your insurance coverage. However, we may be able to find a substitute medication at lower cost or fill out paperwork to get insurance to cover a needed medication.   If a prior authorization is required to get your medication covered by your insurance company, please allow us  1-2 business days to complete this process.  Drug prices often vary depending on where the prescription is filled and some pharmacies  may offer cheaper prices.  The website www.goodrx.com contains coupons for medications through different pharmacies. The prices here do not account for what the cost may be with help from insurance (it may be cheaper with your insurance), but the website can give you the price if you did not use any insurance.  - You can print the associated coupon and take it with your prescription to the pharmacy.  - You may also stop by our office during regular business hours and pick up a GoodRx coupon card.  - If you need your prescription sent electronically to a different pharmacy, notify our office through [facility] or by phone at [phone] option 4.     Si Usted Necesita Algo Despus de Su Visita  Tambin puede enviarnos un mensaje a travs de Clinical Cytogeneticist. Por lo general respondemos a los mensajes de  MyChart en el transcurso de 1 a 2 das hbiles.  Para renovar recetas, por favor pida a su farmacia que se ponga en contacto con nuestra oficina. Randi lakes de fax es [place] [phone].  Si tiene un asunto urgente cuando la clnica est cerrada y que no puede esperar hasta el siguiente da hbil, puede llamar/localizar a su doctor(a) al nmero que aparece a continuacin.   Por favor, tenga en cuenta que aunque hacemos todo lo posible para estar disponibles para asuntos urgentes fuera del horario de [place], no estamos disponibles las 24 horas del da, los 7 [address]  [address] de la [place].   Si tiene un problema urgente y no puede comunicarse con nosotros, puede optar por buscar atencin mdica  en el consultorio de su doctor(a), en una clnica privada, en un centro de atencin urgente o en una sala de emergencias.  Si tiene engineer, drilling, por favor llame inmediatamente al 911 o vaya a la sala de emergencias.  Nmeros de bper  - Dr. Hester: [phone]  - Dra. Jackquline: [phone]  - Dr. Claudene: [phone]  - Dra. Kitts: [phone]  En caso de inclemencias del [place], por favor llame a nuestra lnea principal al [phone] para una actualizacin sobre el estado de cualquier retraso o cierre.  Consejos para la medicacin en dermatologa: Por favor, guarde las cajas en las que vienen los medicamentos de uso tpico para ayudarle a seguir las instrucciones sobre dnde y cmo usarlos. Las farmacias generalmente imprimen las instrucciones del medicamento slo en las cajas y no directamente en los tubos del [place].   Si su medicamento es muy caro, por favor, pngase en contacto con landry rieger llamando al [phone] y presione la opcin 4 o envenos un mensaje a travs de Clinical Cytogeneticist.   No podemos decirle cul ser su copago por los medicamentos por adelantado ya que esto es diferente dependiendo de la cobertura de su seguro. Sin embargo, es posible que podamos encontrar un  medicamento sustituto a audiological scientist un formulario para que el seguro cubra el medicamento que se considera necesario.   Si se requiere una autorizacin previa para que su compaa de seguros cubra su medicamento, por favor permtanos de 1 a 2 das hbiles para completar este proceso.  Los precios de los medicamentos varan con frecuencia dependiendo del environmental consultant de dnde se surte la receta y alguna farmacias pueden ofrecer precios ms baratos.  El sitio web www.goodrx.com tiene cupones para medicamentos de health and safety inspector. Los precios aqu no tienen en cuenta lo que podra costar con la ayuda del seguro (puede ser ms barato con su seguro), pero el sitio web puede  darle el precio si no utiliz [employer].  - Puede imprimir el cupn correspondiente y llevarlo con su receta a la farmacia.  - Tambin puede pasar por nuestra oficina durante el horario de atencin regular y education officer, museum una tarjeta de cupones de GoodRx.  - Si necesita que su receta se enve electrnicamente a una farmacia diferente, informe a nuestra oficina a travs de MyChart de [place] o por telfono llamando al [phone] y presione la opcin 4.

## 2024-09-27 ENCOUNTER — Ambulatory Visit: Admitting: Dermatology

## 2024-09-27 ENCOUNTER — Encounter

## 2024-09-27 ENCOUNTER — Encounter: Payer: Self-pay | Admitting: Dermatology

## 2024-09-27 DIAGNOSIS — L570 Actinic keratosis: Secondary | ICD-10-CM | POA: Diagnosis not present

## 2024-09-27 MED ORDER — AMINOLEVULINIC ACID HCL 10 % EX GEL
2000.0000 mg | Freq: Once | CUTANEOUS | Status: AC
  Administered 2024-09-27: 2000 mg via TOPICAL

## 2024-09-27 NOTE — Progress Notes (Signed)
 Patient completed red light phototherapy with debridement today.  ACTINIC KERATOSES Exam: Erythematous thin papules/macules with gritty scale.  Treatment Plan:  Red Light Photodynamic therapy  Procedure discussed: discussed risks, benefits, side effects. and alternatives   Prep: site scrubbed/prepped with acetone   Debridement needed: Yes (performed by Physician with sand paper.  (CPT [id number])) Location:  face Number of lesions:  Multiple (> 15) Type of treatment:  Red light Aminolevulinic Acid (see MAR for details): Ameluz Aminolevulinic Acid comment:  [id number] Amount of Ameluz (mg):  1 Incubation time (minutes):  60 Number of minutes under lamp:  20 Cooling:  Fan Outcome: patient tolerated procedure well with no complications   Post-procedure details: sunscreen applied and aftercare instructions given to patient    Related Medications Aminolevulinic Acid HCl 10 % GEL 2,000 mg  Amanda White, RMA  I personally debrided area prior to application of aminolevulinic acid  Documentation: I have reviewed the above documentation for accuracy and completeness, and I agree with the above.  Alm Rhyme, MD

## 2024-09-27 NOTE — Patient Instructions (Signed)

## 2024-11-02 ENCOUNTER — Encounter: Admitting: Nurse Practitioner

## 2024-11-14 ENCOUNTER — Other Ambulatory Visit: Payer: 59

## 2024-11-21 ENCOUNTER — Encounter: Payer: 59 | Admitting: Family Medicine

## 2025-01-09 ENCOUNTER — Encounter: Admitting: Dermatology
